# Patient Record
Sex: Female | Born: 1945 | Race: White | Hispanic: No | Marital: Married | Smoking: Never smoker
Health system: Southern US, Community
[De-identification: ages and names within clinical notes are randomized; demographics above are authoritative.]

## PROBLEM LIST (undated history)

## (undated) DIAGNOSIS — E7800 Pure hypercholesterolemia, unspecified: Secondary | ICD-10-CM

## (undated) DIAGNOSIS — F329 Major depressive disorder, single episode, unspecified: Secondary | ICD-10-CM

## (undated) DIAGNOSIS — Z9001 Acquired absence of eye: Secondary | ICD-10-CM

## (undated) DIAGNOSIS — K219 Gastro-esophageal reflux disease without esophagitis: Secondary | ICD-10-CM

## (undated) DIAGNOSIS — F419 Anxiety disorder, unspecified: Secondary | ICD-10-CM

## (undated) DIAGNOSIS — H35343 Macular cyst, hole, or pseudohole, bilateral: Secondary | ICD-10-CM

## (undated) DIAGNOSIS — F32A Depression, unspecified: Secondary | ICD-10-CM

## (undated) DIAGNOSIS — H409 Unspecified glaucoma: Secondary | ICD-10-CM

## (undated) HISTORY — PX: CHOLECYSTECTOMY: SHX55

## (undated) HISTORY — DX: Pure hypercholesterolemia, unspecified: E78.00

## (undated) HISTORY — PX: ABDOMINAL HYSTERECTOMY: SHX81

## (undated) HISTORY — PX: TUBAL LIGATION: SHX77

## (undated) HISTORY — PX: EYE SURGERY: SHX253

---

## 1998-06-29 ENCOUNTER — Other Ambulatory Visit: Admission: RE | Admit: 1998-06-29 | Discharge: 1998-06-29 | Payer: Self-pay | Admitting: Family Medicine

## 2001-10-05 ENCOUNTER — Other Ambulatory Visit: Admission: RE | Admit: 2001-10-05 | Discharge: 2001-10-05 | Payer: Self-pay | Admitting: Family Medicine

## 2003-02-16 ENCOUNTER — Encounter (INDEPENDENT_AMBULATORY_CARE_PROVIDER_SITE_OTHER): Payer: Self-pay | Admitting: *Deleted

## 2003-02-16 ENCOUNTER — Ambulatory Visit (HOSPITAL_COMMUNITY): Admission: RE | Admit: 2003-02-16 | Discharge: 2003-02-16 | Payer: Self-pay | Admitting: Gastroenterology

## 2005-01-24 ENCOUNTER — Ambulatory Visit: Payer: Self-pay | Admitting: Family Medicine

## 2005-02-28 ENCOUNTER — Ambulatory Visit: Payer: Self-pay | Admitting: Family Medicine

## 2005-05-03 ENCOUNTER — Ambulatory Visit: Payer: Self-pay | Admitting: Family Medicine

## 2005-05-20 ENCOUNTER — Ambulatory Visit: Payer: Self-pay | Admitting: Family Medicine

## 2005-05-23 ENCOUNTER — Ambulatory Visit (HOSPITAL_COMMUNITY): Admission: RE | Admit: 2005-05-23 | Discharge: 2005-05-24 | Payer: Self-pay | Admitting: Ophthalmology

## 2005-07-23 ENCOUNTER — Ambulatory Visit: Payer: Self-pay | Admitting: Family Medicine

## 2005-09-19 ENCOUNTER — Ambulatory Visit (HOSPITAL_COMMUNITY): Admission: RE | Admit: 2005-09-19 | Discharge: 2005-09-20 | Payer: Self-pay | Admitting: Ophthalmology

## 2005-10-15 ENCOUNTER — Ambulatory Visit: Payer: Self-pay | Admitting: Family Medicine

## 2005-10-28 ENCOUNTER — Ambulatory Visit: Payer: Self-pay | Admitting: Family Medicine

## 2005-11-28 ENCOUNTER — Ambulatory Visit: Payer: Self-pay | Admitting: Family Medicine

## 2006-03-25 ENCOUNTER — Ambulatory Visit: Payer: Self-pay | Admitting: Family Medicine

## 2006-11-13 ENCOUNTER — Ambulatory Visit (HOSPITAL_COMMUNITY): Admission: RE | Admit: 2006-11-13 | Discharge: 2006-11-13 | Payer: Self-pay | Admitting: Ophthalmology

## 2007-04-13 ENCOUNTER — Ambulatory Visit (HOSPITAL_COMMUNITY): Admission: RE | Admit: 2007-04-13 | Discharge: 2007-04-13 | Payer: Self-pay | Admitting: Family Medicine

## 2007-06-25 ENCOUNTER — Ambulatory Visit (HOSPITAL_COMMUNITY): Admission: RE | Admit: 2007-06-25 | Discharge: 2007-06-26 | Payer: Self-pay | Admitting: Ophthalmology

## 2008-09-27 ENCOUNTER — Ambulatory Visit (HOSPITAL_COMMUNITY): Admission: RE | Admit: 2008-09-27 | Discharge: 2008-09-27 | Payer: Self-pay | Admitting: Family Medicine

## 2009-05-16 ENCOUNTER — Ambulatory Visit (HOSPITAL_COMMUNITY): Admission: RE | Admit: 2009-05-16 | Discharge: 2009-05-17 | Payer: Self-pay | Admitting: Ophthalmology

## 2009-11-13 ENCOUNTER — Ambulatory Visit (HOSPITAL_COMMUNITY): Admission: RE | Admit: 2009-11-13 | Discharge: 2009-11-13 | Payer: Self-pay | Admitting: Family Medicine

## 2009-11-20 ENCOUNTER — Encounter: Admission: RE | Admit: 2009-11-20 | Discharge: 2009-11-20 | Payer: Self-pay | Admitting: Family Medicine

## 2009-12-08 ENCOUNTER — Encounter (INDEPENDENT_AMBULATORY_CARE_PROVIDER_SITE_OTHER): Payer: Self-pay | Admitting: *Deleted

## 2009-12-18 ENCOUNTER — Ambulatory Visit: Payer: Self-pay | Admitting: Internal Medicine

## 2009-12-27 ENCOUNTER — Ambulatory Visit: Payer: Self-pay | Admitting: Internal Medicine

## 2009-12-31 ENCOUNTER — Encounter: Payer: Self-pay | Admitting: Internal Medicine

## 2010-06-05 NOTE — Letter (Signed)
Summary: Patient Notice- Polyp Results  [facility]  [address] [place], [place] [address]   Phone: [phone]  Fax: [phone]        December 31, 2009 MRN: [id number]    Veronica Rojas [address] [place], [place]  [address]    Dear Ms. STAEBELL,  I am pleased to inform you that the colon polyp(s) removed during your recent colonoscopy was (were) found to be benign (no cancer detected) upon pathologic examination.  I recommend you have a repeat colonoscopy examination in 5 years to look for recurrent polyps, as having colon polyps increases your risk for having recurrent polyps or even colon cancer in the future.  Should you develop new or worsening symptoms of abdominal pain, bowel habit changes or bleeding from the rectum or bowels, please schedule an evaluation with either your primary care physician or with me.  Additional information/recommendations:  __ No further action with gastroenterology is needed at this time. Please      follow-up with your primary care physician for your other healthcare      needs.   Please call us if you are having persistent problems or have questions about your condition that have not been fully answered at this time.  Sincerely,  Hilarie Fredrickson MD  This letter has been electronically signed by your physician.  Appended Document: Patient Notice- Polyp Results letter mailed

## 2010-06-05 NOTE — Miscellaneous (Signed)
Summary: RECALL COLON/YF  Clinical Lists Changes  Medications: Added new medication of MOVIPREP 100 GM  SOLR (PEG-KCL-NACL-NASULF-NA ASC-C) As directed - Signed Rx of MOVIPREP 100 GM  SOLR (PEG-KCL-NACL-NASULF-NA ASC-C) As directed;  #1 x 0;  Signed;  Entered by: Clide Cliff RN;  Authorized by: Hilarie Fredrickson MD;  Method used: Electronically to [facility] [address], [place], [place], [place]  [address], Ph: [phone], Fax: [phone] Observations: Added new observation of ALLERGY REV: Done (12/18/2009 13:18)    Prescriptions: MOVIPREP 100 GM  SOLR (PEG-KCL-NACL-NASULF-NA ASC-C) As directed  #1 x 0   Entered by:   Clide Cliff RN   Authorized by:   Hilarie Fredrickson MD   Signed by:   Clide Cliff RN on 12/18/2009   Method used:   Electronically to        [facility]  [address] (retail)       [address]       [place], [place]  [address]       Ph: [phone]       Fax: [phone]   RxID:   [id number]

## 2010-06-05 NOTE — Letter (Signed)
Summary: [facility] Instructions  [facility]  [address] [place], [place] [address]   Phone: [phone]  Fax: [phone]       Veronica Rojas    [birth year]    MRN: [id number]        Procedure Day Dorna [place]:  [facility]  12/27/09     Arrival Time:  10:30AM      Procedure Time:  11:30AM     Location of Procedure:                    _X _  [facility] (4th Floor)  PREPARATION FOR COLONOSCOPY WITH MOVIPREP   Starting 5 days prior to your procedure 12/22/09 do not eat nuts, seeds, popcorn, corn, beans, peas,  salads, or any raw vegetables.  Do not take any fiber supplements (e.g. Metamucil, Citrucel, and Benefiber).  THE DAY BEFORE YOUR PROCEDURE         DATE: 12/26/09  DAY: TUESDAY  1.  Drink clear liquids the entire day-NO SOLID FOOD  2.  Do not drink anything colored red or purple.  Avoid juices with pulp.  No orange juice.  3.  Drink at least 64 oz. (8 glasses) of fluid/clear liquids during the day to prevent dehydration and help the prep work efficiently.  CLEAR LIQUIDS INCLUDE: Water Jello Ice Popsicles Tea (sugar ok, no milk/cream) Powdered fruit flavored drinks Coffee (sugar ok, no milk/cream) Gatorade Juice: apple, white grape, white cranberry  Lemonade Clear bullion, consomm, broth Carbonated beverages (any kind) Strained chicken noodle soup Hard Candy                             4.  In the morning, mix first dose of MoviPrep solution:    Empty 1 Pouch A and 1 Pouch B into the disposable container    Add lukewarm drinking water to the top line of the container. Mix to dissolve    Refrigerate (mixed solution should be used within 24 hrs)  5.  Begin drinking the prep at 5:00 p.m. The MoviPrep container is divided by 4 marks.   Every 15 minutes drink the solution down to the next mark (approximately 8 oz) until the full liter is complete.   6.  Follow completed prep with 16 oz of clear liquid of your choice (Nothing red or purple).   Continue to drink clear liquids until bedtime.  7.  Before going to bed, mix second dose of MoviPrep solution:    Empty 1 Pouch A and 1 Pouch B into the disposable container    Add lukewarm drinking water to the top line of the container. Mix to dissolve    Refrigerate  THE DAY OF YOUR PROCEDURE      DATE: 12/27/09  DAY: ONGEXBMW  Beginning at 6:30AM (5 hours before procedure):         1. Every 15 minutes, drink the solution down to the next mark (approx 8 oz) until the full liter is complete.  2. Follow completed prep with 16 oz. of clear liquid of your choice.    3. You may drink clear liquids until 9:30AM (2 HOURS BEFORE PROCEDURE).   MEDICATION INSTRUCTIONS  Unless otherwise instructed, you should take regular prescription medications with a small sip of water   as early as possible the morning of your procedure.           OTHER INSTRUCTIONS  You will need a responsible  adult at least 67 years of age to accompany you and drive you home.   This person must remain in the waiting room during your procedure.  Wear loose fitting clothing that is easily removed.  Leave jewelry and other valuables at home.  However, you may wish to bring a book to read or  an iPod/MP3 player to listen to music as you wait for your procedure to start.  Remove all body piercing jewelry and leave at home.  Total time from sign-in until discharge is approximately 2-3 hours.  You should go home directly after your procedure and rest.  You can resume normal activities the  day after your procedure.  The day of your procedure you should not:   Drive   Make legal decisions   Operate machinery   Drink alcohol   Return to work  You will receive specific instructions about eating, activities and medications before you leave.    The above instructions have been reviewed and explained to me by   Clide Cliff, RN_______________________    I fully understand and can verbalize these  instructions _____________________________ Date _________

## 2010-06-05 NOTE — Procedures (Signed)
Summary: Colonoscopy  Patient: Veronica Rojas Note: All result statuses are Final unless otherwise noted.  Tests: (1) Colonoscopy (COL)   COL Colonoscopy           DONE     [facility]     [address]     [place], [place]  [address]           COLONOSCOPY PROCEDURE REPORT           PATIENT:  [name], Veronica  MR#:  [id number]     BIRTHDATE:  [birth year], 63 yrs. old  GENDER:  female     ENDOSCOPIST:  Wilhemina Bonito. Eda Keys, MD     REF. BY:  Benedetto Goad, MD     PROCEDURE DATE:  12/27/2009     PROCEDURE:  Colonoscopy with snare polypectomy x 4     ASA CLASS:  Class II     INDICATIONS:  Routine Risk Screening     MEDICATIONS:   Fentanyl 100 mcg IV, Versed 10 mg IV           DESCRIPTION OF PROCEDURE:   After the risks benefits and     alternatives of the procedure were thoroughly explained, informed     consent was obtained.  Digital rectal exam was performed and     revealed no abnormalities.   The LB CF-H180AL [id number] endoscope     was introduced through the anus and advanced to the cecum, which     was identified by both the appendix and ileocecal valve, without     limitations.Time to cecum = 3:00 min.  The quality of the prep was     excellent, using MoviPrep.  The instrument was then slowly     withdrawn (time = 14:24 min.) as the colon was fully examined.     <<PROCEDUREIMAGES>>           FINDINGS:  Four polyps were found ascending colon (1mm),  sigmoid     colon (69mm,3mm) and rectum (2mm). Polyps were snared without     cautery. Retrieval was successful.   This was otherwise a normal     examination of the colon.   Retroflexed views in the rectum     revealed no abnormalities.    The scope was then withdrawn from     the patient and the procedure completed.           COMPLICATIONS:  None     ENDOSCOPIC IMPRESSION:     1) Four polyps - removed     2) Otherwise normal examination           RECOMMENDATIONS:     1) Repeat colonoscopy in 5 years if polyp adenomatous;  otherwise     10 years     ______________________________     Wilhemina Bonito. Eda Keys, MD           CC:  Benedetto Goad, MD; The Patient           n.     eSIGNED:   Wilhemina Bonito. Eda Keys at 12/27/2009 12:44 PM           [name], Veronica, [phone]  Note: An exclamation mark (!) indicates a result that was not dispersed into the flowsheet. Document Creation Date: 12/28/2009 11:30 AM _______________________________________________________________________  (1) Order result status: Final Collection or observation date-time: 12/27/2009 12:33 Requested date-time:  Receipt date-time:  Reported date-time:  Referring Physician:   Ordering Physician: Fransico Setters [phone]) Specimen Source:  Source: Launa Grill Order Number: [id number] Lab site:   Appended Document: Colonoscopy recall     Procedures Next Due Date:    Colonoscopy: 12/2014

## 2010-07-22 LAB — AUTOLOGOUS SERUM PATCH PREP

## 2010-07-22 LAB — CBC
HCT: 37.3 % (ref 36.0–46.0)
Hemoglobin: 13.1 g/dL (ref 12.0–15.0)
RBC: 3.98 MIL/uL (ref 3.87–5.11)
WBC: 6.3 10*3/uL (ref 4.0–10.5)

## 2010-09-18 NOTE — Op Note (Signed)
NAME:  Veronica Rojas, Veronica Rojas              ACCOUNT NO.:  [id number]   MEDICAL RECORD NO.:  [id number]          PATIENT TYPE:  AMB   LOCATION:  SDS                          FACILITY:  [facility]   PHYSICIAN:  John D. Ashley Royalty, M.D. DATE OF BIRTH:  01-Jan-1946   DATE OF PROCEDURE:  06/25/2007  DATE OF DISCHARGE:                               OPERATIVE REPORT   ADMISSION DIAGNOSIS:  Recurrent macular hole, right eye.   PROCEDURES:  Pars plana vitrectomy 20-gauge system, panretinal  photocoagulation, membrane peel, ILM removal, serum patch, right eye and  gas fluid exchange, right eye.   SURGEON:  Alan Mulder, M.D.   ASSISTANT:  Rosalie Doctor, M.A.   ANESTHESIA:  General.   DETAILS:  Usual prep and drape, peritomies at 8, 10 and 2 o'clock.  A 5-  mm infusion port anchored into place at 8 o'clock.  Contact lens ring  anchored into place at 6 and 12 o'clock.  The Provisc was placed on the  corneal surface, and the flat contact lens was placed.  The pars plana  vitrectomy was begun just behind the pseudophakos.  The vitrectomy was  carried out to the far periphery and down to the macular surface.  Preretinal membrane was peeled from the macular region with the lighted  pick ILM forceps, serrated forceps, the diamond-dusted membrane scraper  and the Eagle 27-gauge pick.  Once the membrane was removed, the Eagle  pick was used to engage the ILM peel it from around the macular hole for  360 degrees.  Once the ILM was removed, the instruments were removed  from the eye, and the indirect ophthalmoscope laser was moved into  place.  Then, 239 burns were placed around the retinal periphery with a  power 420 milliwatts, 1000 microns each, and 0.1 seconds each.  The edge  of the macular hole was pushed together with a diamond-dusted membrane  scraper.  A gas fluid exchange was performed.  Sufficient time was  allowed for additional fluid to track down the walls of the eye and  collect in the posterior  segment.  Additional fluid was removed with  [employer] Brush.  Serum patch was delivered.  Perfluoron propane  17% was exchanged for intravitreal gas.  The instruments were removed  from the eye, and 9-0 nylon was used to close the sclerotomy sites.  They were tested and found to be tight.  The conjunctiva was closed with  wet-field cautery.  Polymyxin and gentamicin were irrigated in the tenon  space.  Atropine solution was applied.  Marcaine was injected around the  globe for postoperative pain.  Decadron 10 mg was injected into the  lower subconjunctival space.  Closing pressure was 10 with a Production manager.  TobraDex ophthalmic ointment, a patch and shield were  placed.  The patient was awakened and taken to recovery in satisfactory  condition.      [name]. Ashley Royalty, M.D.  Electronically Signed     JDM/MEDQ  D:  06/25/2007  T:  06/26/2007  Job:  [id number]

## 2010-09-18 NOTE — Op Note (Signed)
NAME:  Griffeth, [place]              ACCOUNT NO.:  [id number]   MEDICAL RECORD NO.:  [id number]          PATIENT TYPE:  AMB   LOCATION:  SDS                          FACILITY:  [facility]   PHYSICIAN:  Chalmers Guest, M.D.     DATE OF BIRTH:  [birth year]   DATE OF PROCEDURE:  11/13/2006  DATE OF DISCHARGE:                               OPERATIVE REPORT   PREOPERATIVE DIAGNOSIS:  Visually significant cataract, left eye.   POSTOPERATIVE DIAGNOSIS:  Visually significant cataract, left eye.   PROCEDURE:  Phakoemulsification with intraocular lens implant.   SURGEON:  Chalmers Guest, M.D.   ANESTHESIA:  Consisted of 2% Xylocaine with epinephrine in  50:50  mixture with 0.75% Marcaine with an ampule of Wydase.   DESCRIPTION OF PROCEDURE:  The patient was transported to the operating  room, where monitors were placed, and the patient was given a peribulbar  block under sedation with the afore-mentioned anesthetic agents.  Following this, the patient's face was prepped and draped in the usual  sterile fashion.  With the surgeon sitting temporally, the operating  microscope was positioned.  A Weck-cel sponge was used to fixate the  globe, and then a 15-degree blade was used to enter at the 6-o'clock  position through clear cornea.  Viscoat was then injected in the eye.  Following this, another Weck-cel sponge was used to fixate the eye, and  a 2.75-mm keratome was then used in a slide fashion to enter through  clear cornea.  Additional viscoelastic was injected, and then a bent 25-  gauge needle was used to incise the anterior capsule, and a curvilinear  capsulorrhexis was formed.  The patient had some remaining  kinesia;  therefore, Xylocaine, preservative free, 0.1 was injected in the  anterior chamber.  Following this, BSS was used to hydrodissect the  nucleus, and then the phakoemulsification unit was used to crack and  divide the nucleus.  All the nuclear fragments were aspirated from the  eye.   The IA was then used to aspirate the cortical fibers from the eye.  The posterior capsule remained intact.  There was some subincisional  cortex remaining.  The intraocular lens implant was then examined.  The  lens was placed in a sterile field and I asked the assistant to inject  the lens and she stated that she did not know she had to use the  injector.  At this point, I turned the microscope light off, and after  about a 10 or 15-minute pause, the injector had been found.  The  injector was loaded.  The lens was placed in the shooter.  The leading  haptic was straight instead of curved. The haptic was then injected into  the capsular bag.  The Kuglen hook was used to rotate the lens into  position.  The lens was pushed back, and then the IA was used to remove  viscoelastic and remaining cortical fibers from the eye.  Miostat was  injected in the eye.  The  incision was hydrated with BSS.  A single 10-0 nylon suture was placed  to secure the incision.  There was a watertight closure with no leakage.  Topical TobraDex was applied to the eye.  A patch and a Fox shield were  placed, and the patient returned to the recovery area in stable  condition.  Complications were none.      Chalmers Guest, M.D.  Electronically Signed     RW/MEDQ  D:  11/13/2006  T:  11/14/2006  Job:  [id number]   cc:   Chalmers Guest, MD

## 2010-09-21 NOTE — Op Note (Signed)
NAME:  Fix, [place]              ACCOUNT NO.:  [id number]   MEDICAL RECORD NO.:  [id number]          PATIENT TYPE:  OIB   LOCATION:  5707                         FACILITY:  [facility]   PHYSICIAN:  [name]. Ashley Royalty, M.D. DATE OF BIRTH:  Sep 14, 1945   DATE OF PROCEDURE:  09/19/2005  DATE OF DISCHARGE:                                 OPERATIVE REPORT   ADMISSION DIAGNOSIS:  Macular hole left eye.   PROCEDURES:  1.  Pars plana vitrectomy.  2.  Membrane peel  3.  Retinal photocoagulation.  4.  Gas fluid exchange.  5.  Serum patch.  6.  Perfluoropropane injection left eye.   SURGEON:  [name]. Ashley Royalty, M.D.   ASSISTANT:  Rosalie Doctor.   ANESTHESIA:  General.   DETAILS:  Usual prep and drape, peritomies at 10, 2, and 4 o'clock.  The 5-  mm infusion port anchored into place at 4 o'clock.  The lighted pick and the  cutter were placed at 10 and 2 o'clock respectively.  Contact lens ring  anchored into place at 6 and 12 o'clock.  Provisc placed on the corneal  surface.  The flat contact lens was placed.  A pars plana vitrectomy was  begun in a core fashion.  The vitrectomy was carried down to the macular  surface where membranes were seen around the macula.  A macular hole was  present.  The silicone tip suction line was introduced into the eye; and the  fish strike sign occurred.   The posterior hyaloid pulled off and also unroofed the macular hole.  These  membranes were then removed with a vitreous cutter.  The vitrectomy was  carried out to the far periphery, down to the vitreous base for 360 degrees.  All vitreous was removed.  The indirect ophthalmoscope laser was moved into  place and 523 burns were placed around the retinal periphery with the power  of 1000 milliwatts, 1000 microns each, and 0.07 seconds each.  A gas fluid  exchange was carried out.  Sufficient time was allowed for additional fluid  to track down the walls of the eye and collect in the posterior segment.  Additional fluid was removed.   Serum patch was delivered.  Perfluoropropane 16% was exchanged for  intravitreal gas.  The instruments were removed from the eye and 9-0 nylon  was used to close the sclerotomy sites.  The conjunctiva was closed with wet-  field cautery.  The wounds were all tested with Weck-cel and found to be  tight.  Polymyxin and gentamicin were irrigated into Tenon's space.  Atropine solution was applied.  Marcaine was injected around the globe for  postop pain Decadron 10 mg was injected into the lower subconjunctival  space.  TobraDex ophthalmic ointment, a patch  and shield were placed.  A closing pressure was 10 with a Baer keratometer.  Complications:  None.  Duration:  1 hour.  A patch and shield were placed.  The patient was awakened and taken to recovery in satisfactory condition.      [name]. Ashley Royalty, M.D.  Electronically Signed  JDM/MEDQ  D:  09/19/2005  T:  09/20/2005  Job:  [id number]

## 2010-09-21 NOTE — Op Note (Signed)
   NAME:  Veronica Rojas, Veronica Rojas                        ACCOUNT NO.:  [id number]   MEDICAL RECORD NO.:  [id number]                   PATIENT TYPE:  AMB   LOCATION:  ENDO                                 FACILITY:  [facility]   PHYSICIAN:  John C. Madilyn Fireman, M.D.                 DATE OF BIRTH:  [birth year]   DATE OF PROCEDURE:  02/16/2003  DATE OF DISCHARGE:                                 OPERATIVE REPORT   PROCEDURE:  Colonoscopy with biopsy.   INDICATION FOR PROCEDURE:  Chronic diarrhea and colon cancer screening.   DESCRIPTION OF PROCEDURE:  The patient was placed in the left lateral  decubitus position and placed on the pulse monitor with continuous low-flow  oxygen delivered by nasal cannula.  She was sedated with 100 mcg IV fentanyl  and 10 mg IV Versed.  The Olympus video colonoscope was inserted into the  rectum and advanced to the cecum, confirmed by transillumination at  McBurney's point and visualization of the ileocecal valve and appendiceal  orifice.  The prep was excellent.  The cecum, ascending, transverse,  descending, and sigmoid colon all appeared normal without masses, polyps,  diverticula, or other mucosal abnormalities.  The rectum likewise appeared  normal and retroflexed view of the anus revealed no obvious internal  hemorrhoids.  Biopsies were taken at the sigmoid and rectum to rule out  microscopic colitis.  The scope was then withdrawn and the patient returned  to the recovery room in stable condition.  She tolerated the procedure well,  and there were no immediate complications.   IMPRESSION:  Normal colonoscopy.   PLAN:  Await biopsy results to rule out microscopic colitis.  If negative,  will probably try an antispasmodic or Questran for her diarrhea.                                                John C. Madilyn Fireman, M.D.    JCH/MEDQ  D:  02/16/2003  T:  02/16/2003  Job:  [id number]

## 2010-09-21 NOTE — Op Note (Signed)
NAME:  Veronica Rojas, Veronica Rojas              ACCOUNT NO.:  [id number]   MEDICAL RECORD NO.:  [id number]          PATIENT TYPE:  AMB   LOCATION:  SDS                          FACILITY:  [facility]   PHYSICIAN:  John D. Ashley Royalty, M.D. DATE OF BIRTH:  10/16/1945   DATE OF PROCEDURE:  05/23/2005  DATE OF DISCHARGE:                                 OPERATIVE REPORT   ADMISSION DIAGNOSIS:  Macular hole, right eye.   PROCEDURES:  Pars plana vitrectomy, membrane peel, serum patch, retinal  photocoagulation, gas fluid exchange, Perfluoropropane injection, right eye.   SURGEON:  Alan Mulder.   ASSISTANT:  Rosalie Doctor, MA   ANESTHESIA:  General.   DETAILS:  Usual prep and drape, peritomies at 8, 10 and 2 o'clock. The 5 mm  infusion port anchored in place at 8 o'clock.  The lighted pick and a cutter  placed at 10 and 2 o'clock respectively. Contact lens ring anchored into  place at 6 and 12 o'clock. Provisc placed in the corneal surface. Flat  contact lens placed. Pars plana vitrectomy was begun just behind the  crystalline lens. Blood and vitreous was encountered in the vitreous cavity.  These membranes were seen down to the macular surface. The entire vitreous  cavity was removed, the entire vitreous was removed with the 20 gauge  vitrectomy system. Attention was carried down to the macular hole region  where Kenalog was used to stain the internal limiting membrane. The silicone  tip suction line was drawn down to the macular region and posterior  membranes were engaged and removed. The diamond dusted membrane scraper was  used to remove the internal limiting membrane around the around the macular  hole. Once the entire ILM was removed, the vitrectomy was carried out to the  peripheral vitreous space.  Vitreous was trimmed down to the vitreous base.  A gas fluid exchange was performed. Before the gas fluid exchange was  performed, the indirect ophthalmoscope laser was moved into place.  453  burns  were placed around the retinal periphery with a power 500 milliwatts,  1000 microns each and 0.05 seconds each.  Gas fluid exchange was performed.  Sufficient time was allowed for additional fluid to track down the walls of  the eye and collect in the posterior segment.  Serum patch and  Perfluoropropane were prepared.  17% C3F8 was used. The additional fluid was  removed with the [employer] [place] [employer] brush. The serum patch was delivered. The  Perfluoropropane 17% was exchanged for intravitreal air. The instruments  were removed from the eye. 9-0 nylon was used to close the sclerotomy sites.  The sites were tested and found to be tight. Conjunctiva was closed with wet-  field cautery. Polymyxin and gentamicin were irrigated into Tenon's space,  Decadron 10 milligrams was injected to the lower subconjunctival space.  Marcaine was injected around the globe  for postop pain. TobraDex ophthalmic ointment, patch and shield were placed.  Closing pressure was 10 with a Barraquer tonometer. Complications none.  Duration 1 hour. The patient was awakened, taken to recovery in satisfactory  condition.  [name]. Ashley Royalty, M.D.  Electronically Signed     JDM/MEDQ  D:  05/23/2005  T:  05/23/2005  Job:  [id number]

## 2010-12-17 ENCOUNTER — Encounter (INDEPENDENT_AMBULATORY_CARE_PROVIDER_SITE_OTHER): Payer: Medicare Other | Admitting: Ophthalmology

## 2010-12-17 DIAGNOSIS — H35349 Macular cyst, hole, or pseudohole, unspecified eye: Secondary | ICD-10-CM

## 2010-12-17 DIAGNOSIS — H3530 Unspecified macular degeneration: Secondary | ICD-10-CM

## 2011-01-10 ENCOUNTER — Other Ambulatory Visit: Payer: Self-pay | Admitting: Family Medicine

## 2011-01-10 DIAGNOSIS — Z1231 Encounter for screening mammogram for malignant neoplasm of breast: Secondary | ICD-10-CM

## 2011-01-10 DIAGNOSIS — M8580 Other specified disorders of bone density and structure, unspecified site: Secondary | ICD-10-CM

## 2011-01-25 ENCOUNTER — Ambulatory Visit
Admission: RE | Admit: 2011-01-25 | Discharge: 2011-01-25 | Disposition: A | Discharge status: Home / Self Care | Payer: Medicare Other | Source: Ambulatory Visit | Attending: Family Medicine | Admitting: Family Medicine

## 2011-01-25 DIAGNOSIS — M8580 Other specified disorders of bone density and structure, unspecified site: Secondary | ICD-10-CM

## 2011-01-25 DIAGNOSIS — Z1231 Encounter for screening mammogram for malignant neoplasm of breast: Secondary | ICD-10-CM

## 2011-01-25 LAB — CBC
HCT: 35.3 — ABNORMAL LOW
MCHC: 34
MCV: 86.6
Platelets: 293
WBC: 5.9

## 2011-01-25 LAB — AUTOLOGOUS SERUM PATCH PREP

## 2011-01-30 ENCOUNTER — Other Ambulatory Visit: Payer: Medicare Other

## 2011-01-30 ENCOUNTER — Ambulatory Visit
Admission: RE | Admit: 2011-01-30 | Discharge: 2011-01-30 | Disposition: A | Discharge status: Home / Self Care | Payer: Medicare Other | Source: Ambulatory Visit | Attending: Family Medicine | Admitting: Family Medicine

## 2011-02-19 LAB — CBC
HCT: 41.8
RBC: 4.59

## 2011-06-26 ENCOUNTER — Ambulatory Visit (INDEPENDENT_AMBULATORY_CARE_PROVIDER_SITE_OTHER): Payer: Medicare Other | Admitting: Ophthalmology

## 2011-06-26 DIAGNOSIS — H3530 Unspecified macular degeneration: Secondary | ICD-10-CM

## 2011-06-26 DIAGNOSIS — H35349 Macular cyst, hole, or pseudohole, unspecified eye: Secondary | ICD-10-CM

## 2011-06-26 DIAGNOSIS — H4010X0 Unspecified open-angle glaucoma, stage unspecified: Secondary | ICD-10-CM

## 2011-12-25 ENCOUNTER — Ambulatory Visit (INDEPENDENT_AMBULATORY_CARE_PROVIDER_SITE_OTHER): Payer: Medicare Other | Admitting: Ophthalmology

## 2011-12-25 DIAGNOSIS — H35379 Puckering of macula, unspecified eye: Secondary | ICD-10-CM

## 2011-12-25 DIAGNOSIS — H4010X0 Unspecified open-angle glaucoma, stage unspecified: Secondary | ICD-10-CM

## 2011-12-25 DIAGNOSIS — H35349 Macular cyst, hole, or pseudohole, unspecified eye: Secondary | ICD-10-CM

## 2012-01-23 ENCOUNTER — Other Ambulatory Visit: Payer: Self-pay | Admitting: Family Medicine

## 2012-01-23 DIAGNOSIS — Z1231 Encounter for screening mammogram for malignant neoplasm of breast: Secondary | ICD-10-CM

## 2012-01-30 SURGERY — Surgical Case
Anesthesia: *Unknown

## 2012-03-23 ENCOUNTER — Ambulatory Visit
Admission: RE | Admit: 2012-03-23 | Discharge: 2012-03-23 | Disposition: A | Discharge status: Home / Self Care | Payer: Medicare Other | Source: Ambulatory Visit | Attending: Family Medicine | Admitting: Family Medicine

## 2012-03-23 DIAGNOSIS — Z1231 Encounter for screening mammogram for malignant neoplasm of breast: Secondary | ICD-10-CM

## 2012-03-24 ENCOUNTER — Other Ambulatory Visit: Payer: Self-pay | Admitting: Family Medicine

## 2012-03-24 DIAGNOSIS — R928 Other abnormal and inconclusive findings on diagnostic imaging of breast: Secondary | ICD-10-CM

## 2012-04-09 ENCOUNTER — Ambulatory Visit
Admission: RE | Admit: 2012-04-09 | Discharge: 2012-04-09 | Disposition: A | Discharge status: Home / Self Care | Payer: Medicare Other | Source: Ambulatory Visit | Attending: Family Medicine | Admitting: Family Medicine

## 2012-04-09 DIAGNOSIS — R928 Other abnormal and inconclusive findings on diagnostic imaging of breast: Secondary | ICD-10-CM

## 2012-06-26 ENCOUNTER — Ambulatory Visit (INDEPENDENT_AMBULATORY_CARE_PROVIDER_SITE_OTHER): Payer: Medicare Other | Admitting: Ophthalmology

## 2012-06-26 DIAGNOSIS — H35379 Puckering of macula, unspecified eye: Secondary | ICD-10-CM

## 2013-02-22 ENCOUNTER — Other Ambulatory Visit: Payer: Self-pay | Admitting: Family Medicine

## 2013-02-22 DIAGNOSIS — R921 Mammographic calcification found on diagnostic imaging of breast: Secondary | ICD-10-CM

## 2013-03-24 ENCOUNTER — Ambulatory Visit
Admission: RE | Admit: 2013-03-24 | Discharge: 2013-03-24 | Disposition: A | Discharge status: Home / Self Care | Payer: Medicare Other | Source: Ambulatory Visit | Attending: Family Medicine | Admitting: Family Medicine

## 2013-03-24 DIAGNOSIS — R921 Mammographic calcification found on diagnostic imaging of breast: Secondary | ICD-10-CM

## 2013-07-02 ENCOUNTER — Ambulatory Visit (INDEPENDENT_AMBULATORY_CARE_PROVIDER_SITE_OTHER): Payer: Medicare Other | Admitting: Ophthalmology

## 2013-07-02 DIAGNOSIS — H4010X0 Unspecified open-angle glaucoma, stage unspecified: Secondary | ICD-10-CM

## 2013-07-02 DIAGNOSIS — H35349 Macular cyst, hole, or pseudohole, unspecified eye: Secondary | ICD-10-CM

## 2014-05-19 ENCOUNTER — Other Ambulatory Visit: Payer: Self-pay | Admitting: Family Medicine

## 2014-05-19 DIAGNOSIS — R921 Mammographic calcification found on diagnostic imaging of breast: Secondary | ICD-10-CM

## 2014-05-24 ENCOUNTER — Ambulatory Visit
Admission: RE | Admit: 2014-05-24 | Discharge: 2014-05-24 | Disposition: A | Discharge status: Home / Self Care | Payer: Medicare Other | Source: Ambulatory Visit | Attending: Family Medicine | Admitting: Family Medicine

## 2014-05-24 DIAGNOSIS — R921 Mammographic calcification found on diagnostic imaging of breast: Secondary | ICD-10-CM

## 2014-07-11 ENCOUNTER — Ambulatory Visit (INDEPENDENT_AMBULATORY_CARE_PROVIDER_SITE_OTHER): Payer: Medicare Other | Admitting: Ophthalmology

## 2014-07-11 DIAGNOSIS — H35343 Macular cyst, hole, or pseudohole, bilateral: Secondary | ICD-10-CM

## 2014-07-11 DIAGNOSIS — H4011X2 Primary open-angle glaucoma, moderate stage: Secondary | ICD-10-CM | POA: Diagnosis not present

## 2014-07-11 DIAGNOSIS — H59032 Cystoid macular edema following cataract surgery, left eye: Secondary | ICD-10-CM | POA: Diagnosis not present

## 2014-11-04 ENCOUNTER — Encounter: Payer: Self-pay | Admitting: Internal Medicine

## 2014-11-10 ENCOUNTER — Ambulatory Visit (INDEPENDENT_AMBULATORY_CARE_PROVIDER_SITE_OTHER): Payer: Medicare Other | Admitting: Ophthalmology

## 2014-11-10 DIAGNOSIS — H35343 Macular cyst, hole, or pseudohole, bilateral: Secondary | ICD-10-CM

## 2014-11-10 DIAGNOSIS — H59032 Cystoid macular edema following cataract surgery, left eye: Secondary | ICD-10-CM

## 2014-11-10 DIAGNOSIS — H3531 Nonexudative age-related macular degeneration: Secondary | ICD-10-CM

## 2014-11-23 ENCOUNTER — Encounter: Payer: Self-pay | Admitting: Internal Medicine

## 2015-01-11 ENCOUNTER — Encounter (INDEPENDENT_AMBULATORY_CARE_PROVIDER_SITE_OTHER): Payer: Medicare Other | Admitting: Ophthalmology

## 2015-01-11 DIAGNOSIS — H35343 Macular cyst, hole, or pseudohole, bilateral: Secondary | ICD-10-CM | POA: Diagnosis not present

## 2015-01-11 DIAGNOSIS — H59032 Cystoid macular edema following cataract surgery, left eye: Secondary | ICD-10-CM | POA: Diagnosis not present

## 2015-04-12 ENCOUNTER — Ambulatory Visit (HOSPITAL_COMMUNITY)
Admission: RE | Admit: 2015-04-12 | Discharge: 2015-04-12 | Disposition: A | Discharge status: Home / Self Care | Payer: Medicare Other | Source: Ambulatory Visit | Attending: Ophthalmology | Admitting: Ophthalmology

## 2015-04-12 ENCOUNTER — Ambulatory Visit (HOSPITAL_COMMUNITY): Payer: Medicare Other | Admitting: Anesthesiology

## 2015-04-12 ENCOUNTER — Other Ambulatory Visit: Payer: Self-pay | Admitting: Ophthalmology

## 2015-04-12 ENCOUNTER — Encounter (HOSPITAL_COMMUNITY)
Admission: RE | Disposition: A | Discharge status: Home / Self Care | Payer: Self-pay | Source: Ambulatory Visit | Attending: Ophthalmology

## 2015-04-12 ENCOUNTER — Encounter (HOSPITAL_COMMUNITY): Payer: Self-pay | Admitting: *Deleted

## 2015-04-12 DIAGNOSIS — Y838 Other surgical procedures as the cause of abnormal reaction of the patient, or of later complication, without mention of misadventure at the time of the procedure: Secondary | ICD-10-CM | POA: Diagnosis not present

## 2015-04-12 DIAGNOSIS — T814XXA Infection following a procedure, initial encounter: Secondary | ICD-10-CM | POA: Insufficient documentation

## 2015-04-12 DIAGNOSIS — H409 Unspecified glaucoma: Secondary | ICD-10-CM | POA: Diagnosis not present

## 2015-04-12 DIAGNOSIS — H44002 Unspecified purulent endophthalmitis, left eye: Secondary | ICD-10-CM | POA: Insufficient documentation

## 2015-04-12 DIAGNOSIS — Z9689 Presence of other specified functional implants: Secondary | ICD-10-CM | POA: Diagnosis not present

## 2015-04-12 DIAGNOSIS — H20052 Hypopyon, left eye: Secondary | ICD-10-CM | POA: Insufficient documentation

## 2015-04-12 HISTORY — PX: PARS PLANA VITRECTOMY: SHX2166

## 2015-04-12 LAB — POCT I-STAT 4, (NA,K, GLUC, HGB,HCT)
Glucose, Bld: 113 mg/dL — ABNORMAL HIGH (ref 65–99)
HCT: 41 % (ref 36.0–46.0)
HEMOGLOBIN: 13.9 g/dL (ref 12.0–15.0)
Potassium: 3.7 mmol/L (ref 3.5–5.1)
Sodium: 138 mmol/L (ref 135–145)

## 2015-04-12 LAB — GRAM STAIN

## 2015-04-12 SURGERY — PARS PLANA VITRECTOMY 25 GAUGE FOR ENDOPHTHALMITIS
Anesthesia: General | Site: Eye | Laterality: Left

## 2015-04-12 SURGERY — PARS PLANA VITRECTOMY 25 GAUGE FOR ENDOPHTHALMITIS
Anesthesia: Monitor Anesthesia Care | Laterality: Left

## 2015-04-12 MED ORDER — ACETAMINOPHEN 10 MG/ML IV SOLN
1000.0000 mg | Freq: Once | INTRAVENOUS | Status: AC
  Administered 2015-04-12: 1000 mg via INTRAVENOUS

## 2015-04-12 MED ORDER — CEFTAZIDIME INTRAVITREAL INJECTION 2.25 MG/0.1 ML
2.2500 mg | INTRAVITREAL | Status: AC
  Administered 2015-04-12: 2.25 mg via INTRAVITREAL
  Filled 2015-04-12: qty 0.1

## 2015-04-12 MED ORDER — HYDROMORPHONE HCL 1 MG/ML IJ SOLN
INTRAMUSCULAR | Status: AC
  Filled 2015-04-12: qty 1

## 2015-04-12 MED ORDER — ACETAMINOPHEN 10 MG/ML IV SOLN
INTRAVENOUS | Status: AC
  Administered 2015-04-12: 1000 mg via INTRAVENOUS
  Filled 2015-04-12: qty 100

## 2015-04-12 MED ORDER — PROPOFOL 10 MG/ML IV BOLUS
INTRAVENOUS | Status: DC | PRN
  Administered 2015-04-12: 200 mg via INTRAVENOUS
  Administered 2015-04-12: 40 mg via INTRAVENOUS

## 2015-04-12 MED ORDER — BSS PLUS IO SOLN
INTRAOCULAR | Status: AC
  Filled 2015-04-12: qty 500

## 2015-04-12 MED ORDER — HYDROMORPHONE HCL 1 MG/ML IJ SOLN
0.2500 mg | INTRAMUSCULAR | Status: DC | PRN
  Administered 2015-04-12: 0.25 mg via INTRAVENOUS

## 2015-04-12 MED ORDER — SUCCINYLCHOLINE CHLORIDE 20 MG/ML IJ SOLN
INTRAMUSCULAR | Status: DC | PRN
  Administered 2015-04-12: 120 mg via INTRAVENOUS

## 2015-04-12 MED ORDER — VANCOMYCIN INTRAVITREAL INJECTION 1 MG/0.1 ML
1.0000 mg | INTRAOCULAR | Status: AC
  Administered 2015-04-12: 1 mg via INTRAVITREAL
  Filled 2015-04-12: qty 0.1

## 2015-04-12 MED ORDER — HYPROMELLOSE (GONIOSCOPIC) 2.5 % OP SOLN
OPHTHALMIC | Status: DC | PRN
  Administered 2015-04-12: 1 [drp] via OPHTHALMIC

## 2015-04-12 MED ORDER — MEPERIDINE HCL 25 MG/ML IJ SOLN
6.2500 mg | INTRAMUSCULAR | Status: DC | PRN
Start: 2015-04-12 — End: 2015-04-13

## 2015-04-12 MED ORDER — EPINEPHRINE HCL 1 MG/ML IJ SOLN
INTRAMUSCULAR | Status: AC
  Filled 2015-04-12: qty 1

## 2015-04-12 MED ORDER — BSS PLUS IO SOLN
INTRAOCULAR | Status: DC | PRN
  Administered 2015-04-12: 1 via INTRAOCULAR

## 2015-04-12 MED ORDER — 0.9 % SODIUM CHLORIDE (POUR BTL) OPTIME
TOPICAL | Status: DC | PRN
  Administered 2015-04-12: 200 mL

## 2015-04-12 MED ORDER — EPINEPHRINE HCL 1 MG/ML IJ SOLN
INTRAMUSCULAR | Status: DC | PRN
  Administered 2015-04-12: .3 mL

## 2015-04-12 MED ORDER — LIDOCAINE HCL 2 % IJ SOLN
INTRAMUSCULAR | Status: DC | PRN
  Administered 2015-04-12: 10 mL

## 2015-04-12 MED ORDER — BSS IO SOLN
INTRAOCULAR | Status: DC | PRN
  Administered 2015-04-12: 15 mL

## 2015-04-12 MED ORDER — VANCOMYCIN HCL IN DEXTROSE 1-5 GM/200ML-% IV SOLN
1000.0000 mg | Freq: Once | INTRAVENOUS | Status: AC
  Administered 2015-04-12: 1000 mg via INTRAVENOUS
  Filled 2015-04-12: qty 200

## 2015-04-12 MED ORDER — LACTATED RINGERS IV SOLN
INTRAVENOUS | Status: DC | PRN
  Administered 2015-04-12: 18:00:00 via INTRAVENOUS

## 2015-04-12 MED ORDER — VANCOMYCIN HCL IN DEXTROSE 1-5 GM/200ML-% IV SOLN: 1000.0000 mg | Freq: Once | INTRAVENOUS | Status: DC

## 2015-04-12 MED ORDER — LACTATED RINGERS IV SOLN
INTRAVENOUS | Status: DC
  Administered 2015-04-12: 18:00:00 via INTRAVENOUS

## 2015-04-12 MED ORDER — ONDANSETRON HCL 4 MG/2ML IJ SOLN
INTRAMUSCULAR | Status: DC | PRN
  Administered 2015-04-12: 4 mg via INTRAVENOUS

## 2015-04-12 MED ORDER — DEXTROSE 5 % IV SOLN
1.0000 g | Freq: Once | INTRAVENOUS | Status: AC
  Administered 2015-04-12: 1 g via INTRAVENOUS
  Filled 2015-04-12: qty 1

## 2015-04-12 MED ORDER — FENTANYL CITRATE (PF) 100 MCG/2ML IJ SOLN
INTRAMUSCULAR | Status: DC | PRN
  Administered 2015-04-12: 100 ug via INTRAVENOUS
  Administered 2015-04-12: 25 ug via INTRAVENOUS

## 2015-04-12 MED ORDER — HYPROMELLOSE (GONIOSCOPIC) 2.5 % OP SOLN
OPHTHALMIC | Status: AC
  Filled 2015-04-12: qty 15

## 2015-04-12 MED ORDER — LIDOCAINE HCL (CARDIAC) 20 MG/ML IV SOLN
INTRAVENOUS | Status: DC | PRN
  Administered 2015-04-12: 80 mg via INTRAVENOUS

## 2015-04-12 MED ORDER — FENTANYL CITRATE (PF) 250 MCG/5ML IJ SOLN
INTRAMUSCULAR | Status: AC
  Filled 2015-04-12: qty 5

## 2015-04-12 MED ORDER — PROMETHAZINE HCL 25 MG/ML IJ SOLN
6.2500 mg | INTRAMUSCULAR | Status: DC | PRN
Start: 2015-04-12 — End: 2015-04-13

## 2015-04-12 MED ORDER — DEXAMETHASONE SODIUM PHOSPHATE 10 MG/ML IJ SOLN
INTRAMUSCULAR | Status: AC
  Filled 2015-04-12: qty 1

## 2015-04-12 MED ORDER — DEXAMETHASONE SODIUM PHOSPHATE 10 MG/ML IJ SOLN
INTRAMUSCULAR | Status: DC | PRN
  Administered 2015-04-12: 10 mg

## 2015-04-12 SURGICAL SUPPLY — 43 items
APPLICATOR COTTON TIP 6IN STRL (MISCELLANEOUS) ×3 IMPLANT
APPLICATOR DR MATTHEWS STRL (MISCELLANEOUS) IMPLANT
BLADE 10 SAFETY STRL DISP (BLADE) IMPLANT
CANNULA ANT CHAM MAIN (OPHTHALMIC RELATED) IMPLANT
CANNULA FLEX TIP 25G (CANNULA) IMPLANT
CANNULA VLV SOFT TIP 25GA (OPHTHALMIC) ×3 IMPLANT
CORDS BIPOLAR (ELECTRODE) IMPLANT
COVER MAYO STAND STRL (DRAPES) ×3 IMPLANT
DRAPE OPHTHALMIC 77X100 STRL (CUSTOM PROCEDURE TRAY) ×3 IMPLANT
FILTER BLUE MILLIPORE (MISCELLANEOUS) IMPLANT
FILTER STRAW FLUID ASPIR (MISCELLANEOUS) IMPLANT
FORCEPS ECKARDT ILM 25G SERR (OPHTHALMIC RELATED) ×3 IMPLANT
GLOVE BIOGEL PI IND STRL 7.0 (GLOVE) ×1 IMPLANT
GLOVE BIOGEL PI INDICATOR 7.0 (GLOVE) ×2
GLOVE SS BIOGEL STRL SZ 8.5 (GLOVE) ×1 IMPLANT
GLOVE SUPERSENSE BIOGEL SZ 8.5 (GLOVE) ×2
GOWN STRL REUS W/ TWL LRG LVL3 (GOWN DISPOSABLE) ×1 IMPLANT
GOWN STRL REUS W/TWL LRG LVL3 (GOWN DISPOSABLE) ×2
KIT BASIN OR (CUSTOM PROCEDURE TRAY) ×3 IMPLANT
KIT ROOM TURNOVER OR (KITS) ×3 IMPLANT
KNIFE GRIESHABER SHARP 2.5MM (MISCELLANEOUS) ×3 IMPLANT
LENS BIOM SUPER VIEW SET DISP (OPHTHALMIC RELATED) ×3 IMPLANT
NEEDLE 18GX1X1/2 (RX/OR ONLY) (NEEDLE) ×3 IMPLANT
NEEDLE 25GX 5/8IN NON SAFETY (NEEDLE) ×3 IMPLANT
NEEDLE HYPO 30X.5 LL (NEEDLE) ×6 IMPLANT
PACK VITRECTOMY CUSTOM (CUSTOM PROCEDURE TRAY) ×3 IMPLANT
PAD ARMBOARD 7.5X6 YLW CONV (MISCELLANEOUS) ×6 IMPLANT
PAK PIK VITRECTOMY CVS 25GA (OPHTHALMIC) ×3 IMPLANT
PENCIL BIPOLAR 25GA STR DISP (OPHTHALMIC RELATED) IMPLANT
PROBE LASER ILLUM FLEX CVD 25G (OPHTHALMIC) IMPLANT
RESERVOIR BACK FLUSH (MISCELLANEOUS) IMPLANT
ROLLS DENTAL (MISCELLANEOUS) IMPLANT
STOCKINETTE IMPERVIOUS 9X36 MD (GAUZE/BANDAGES/DRESSINGS) ×9 IMPLANT
STOPCOCK 4 WAY LG BORE MALE ST (IV SETS) IMPLANT
SUT ETHILON 8 0 TG100 8 (SUTURE) ×3 IMPLANT
SUT MERSILENE 5 0 RD 1 DA (SUTURE) IMPLANT
SUT VICRYL 7 0 TG140 8 (SUTURE) ×3 IMPLANT
SWAB COLLECTION DEVICE MRSA (MISCELLANEOUS) ×3 IMPLANT
SYR TB 1ML LUER SLIP (SYRINGE) ×6 IMPLANT
TAPE SURG TRANSPORE 1 IN (GAUZE/BANDAGES/DRESSINGS) ×1 IMPLANT
TAPE SURGICAL TRANSPORE 1 IN (GAUZE/BANDAGES/DRESSINGS) ×2
TOWEL OR 17X24 6PK STRL BLUE (TOWEL DISPOSABLE) ×6 IMPLANT
TUBE ANAEROBIC SPECIMEN COL (MISCELLANEOUS) IMPLANT

## 2015-04-12 NOTE — Transfer of Care (Signed)
Immediate Anesthesia Transfer of Care Note  Patient: Veronica Rojas  Procedure(s) Performed: Procedure(s): PARS PLANA VITRECTOMY 25 GAUGE FOR ENDOPHTHALMITIS/VITREOUS TAP/INJECTION OF ANTIBOTICS, REVISION OF OPERATIVE WOUND, CONJUNCTIVA FLAP (Left)  Patient Location: PACU  Anesthesia Type:General  Level of Consciousness: awake, alert  and oriented  Airway & Oxygen Therapy: Patient Spontanous Breathing and Patient connected to nasal cannula oxygen  Post-op Assessment: Report given to RN and Post -op Vital signs reviewed and stable  Post vital signs: Reviewed and stable  Last Vitals: There were no vitals filed for this visit.  Complications: No apparent anesthesia complications

## 2015-04-12 NOTE — Anesthesia Postprocedure Evaluation (Signed)
Anesthesia Post Note  Patient: Veronica Rojas  Procedure(s) Performed: Procedure(s) (LRB): PARS PLANA VITRECTOMY 25 GAUGE FOR ENDOPHTHALMITIS/VITREOUS TAP/INJECTION OF ANTIBOTICS, REVISION OF OPERATIVE WOUND, CONJUNCTIVA FLAP (Left)  Patient location during evaluation: PACU Anesthesia Type: General Level of consciousness: awake and alert Pain management: pain level controlled Vital Signs Assessment: post-procedure vital signs reviewed and stable Respiratory status: spontaneous breathing Cardiovascular status: blood pressure returned to baseline Anesthetic complications: no    Last Vitals:  Filed Vitals:   04/12/15 2100 04/12/15 2115  BP: 141/66 99/86  Pulse: 65 64  Temp:  36.7 C  Resp: 12 11    Last Pain:  Filed Vitals:   04/12/15 2119  PainSc: 1                  Kennieth RadFitzgerald, Porche Steinberger E

## 2015-04-12 NOTE — H&P (Signed)
Patient has long-standing history of advanced glaucoma progressive despite maximum medical therapy and finally surgical therapy including glaucoma shunt, valve placement in the left eye.  She has 1 day onset of severe pain profound loss of vision worsened before. They have on examination by Dr. Alden HippGrote evidence of hypopyon and endophthalmitis of the left eye. It is suspected that some form of wound leak or dehiscence of the wound. Examination left eye discloses him motion vision. Right eye is 20/20 5. Left eye is hand motion.  Patient left eye discloses a 3+ injection of the conjunctiva with overall corneal haze present. Anterior chamber is deep formed with 2+ fibrin and 3+ white cells and a 1 mm hypopyon. Anterior's of vitreous is notable for 1+ white cells as well suspected therefore that this is a complete endophthalmitis.  B-scan ultrasonography discloses dense vitreous debris in the left eye however there is no retinal detachment.   Impression #1 acute endophthalmitis left eye with a hypopyon left eye.  Plan diagnostic vitreous tap, diagnostic vitrectomy, therapeutic vitrectomy, an injection therapeutic antibiotics intravitreally planning Fortaz 2.25 mg and vancomycin 1.0 mg left eye.

## 2015-04-12 NOTE — Brief Op Note (Signed)
Preoperative diagnosis: #1 endophthalmitis, acute left eye. #2 wound related infection left eye, previous glaucoma shunt superotemporal new  Postoperative diagnoses number 1-2 same  Procedure: #1 diagnostic and therapeutic vitrectomy left eye  #2 diagnostic vitreous tap, anterior chamber tap, left eye  #3 revision of operative wound with conjunctival plasty, conjunctival rotational flap, left eye.  Surgeon Jillyn HiddenGary a Sue Mcalexander M.D.

## 2015-04-12 NOTE — OR Nursing (Addendum)
Cultures to Lab:   Vitreous fluid for cassette Left eye for Cytospin, Routine Culture, StatGram stain,     Agar plates and aerobic culture tube - Anterior Chamber fluid tap Left eye

## 2015-04-12 NOTE — Op Note (Signed)
Preoperative diagnosis: #1 endophthalmitis, acute left eye. #2 wound related infection left eye, previous glaucoma shunt superotemporal new  Postoperative diagnoses number 1-2 same  Procedure: #1 diagnostic and therapeutic vitrectomy left eye  #2 diagnostic vitreous tap, anterior chamber tap, left eye  #3 revision of operative wound with conjunctival plasty, conjunctival rotational flap, left eye.  Surgeon Edmon CrapeGary a Rankin M.D.  Gen. endotracheal anesthesia  Indication for procedure: Patient is a long-standing history of advanced glaucoma requiring multiple surgical interventions in maximal tolerated medical therapy. She had a glaucoma shunt valve placement superotemporal quadrant. She has developed apparent endophthalmitis with hypopyon fibrin deposition and vitrectomy tried this is well of the left eye the last 24 hours. Semination as well as B-scan ultrasonography confirmed the presence of significant vitreous opacification and the company with profound vision loss and the hypopyon patient suspected having acute endophthalmitis possibly wound related via wound dehiscence.  He understands us in attempt to attend diagnosis the more poorly therapeutic vitrectomy to deliver and to deliver intravitreal antibiotics to left eye. She understands risk of anesthesia including the rare occurrence death but also to the eye from the underlying condition and the surgical intervention including but not limited to hemorrhage, infection, scarring, need another surgery, no change of vision, loss of vision and progression of disease despite intervention.  At appropriate signed consent was obtained patient taken to the operating room. Operating room general checkup anesthesia instituted difficulty. Surgical timeout carried out with the OR staff and surgeon operative site of the left eye that identified. Thereafter I was sterilely prepped and draped usual fashion. Second surgical timeout was carried out with staff and  surgeon. At this time lid speculum applied. 25-gauge trocar placed initially in the inferotemporal quadrant. Patient verified visually. 30-gauge needle was then used to create an anterior chamber tap diagnostic aspirate. This is plated directly to blood auger intraocular plates as well as transport medium.  At this time spear trochars were applied. Limited core vitrectomy was then begun. Pupillary fibrin was noted which limited posterior visualization. This reason I elected to remove the superonasal temporal trocar and to replace it into the anterior chamber. Then switch the infusion site the anterior chamber plan under low effusion pressures was able to create a paracentesis incision separately and using the vitrectomy is mentation 25-gauge is able to engage pupillary fibrin membrane as well as fibrin that was emanating from the superotemporal tube entry site and in the anterior chamber angle. This clear the visual axis nicely. Eye from a posterior approach greatest very small inferior peripheral iridectomy to allow for equilibration of the infusion pressures thus maintain the infusion the anterior chamber and to maintain clear visualization. At this time I was able to then approach up via the inferotemporal light.lighted instrument as well as the superonasal vitrectomy management to continue core vitrectomy clear the visual axis. Notable findings posteriorly whitening out of retinal vasculature which was completely whited out with margination of white cells. Retinal hemorrhage was also noted. The optic nerve is of obscured by white cells.  This time and she was removed from the eye. The inferotemporal and the superonasal trochars were removed from the eye. Secure. I left the anterior chamber infusion in place under low effusion pressures and I could see that there was a gap at the eye conjunctival flexion near the corneal limbal region allowed for exposure of the tube entering the anterior chamber. At this  level at this time I elected to create a reflection and dissection the conjunctival  near limbus in the entire superotemporal quadrant. I open the timing limited peritomy and the superotemporal quadrant and dissected posteriorly. Roundness thin sheet of tissue covering the tube posteriorly. This was not secured at this time and I was able to irrigate this area anion secure it overlying the to begin with vertical mattress 7-0 Vicryl suture posterior to limbus to continue the current create a coverage for the tube to insertion into the anterior chamber. I elected to not remove the tube. After mobilization the conjunctiva rotating the conjunctival temporally and scar 5 the peripheral corneal epithelium onto gray and in clear cornea. Allow me to advance the conjunctiva onto the clear cornea adequate coverage of the insertion of the tube into its current canal. The conjunctiva was then secured in a vertical mattress fashion to through to the clear cornea advancing over the tube insertion site. The lateral wings of the conjunctiva were closed with vertical mattress 7-0 Vicryl suture to the conjunctiva and sclera. Created a secure watertight closure. Was now infusion site of the intrachamber was now removed and there is no spontaneous egress of fluid noted in this fashion. This time a globe with appropriate tension I did place the intravitreal antibiotics  0.1 mL of vancomycin, 1 mg. And 0.1 mL of Fortaz, 2.25 mg into the vitreous cavity. I injected additional antibiotics inferiorly and subchondral Tylenol fashion.  Indeterminate the wound was secure. The intraocular pressures appropriate. I remove the lid speculum. Counts are appropriate. Sterile patch function applied gently applied to the eye. Patient taken the PACU in good stable condition hemodynamically. She'll be sent home as an outpatient later tonight after completion of  intravenous anabiotic.

## 2015-04-12 NOTE — Anesthesia Procedure Notes (Signed)
Procedure Name: Intubation Date/Time: 04/12/2015 6:30 PM Performed by: Arlice ColtMANESS, Lijah Bourque B Pre-anesthesia Checklist: Patient identified, Emergency Drugs available, Suction available, Patient being monitored and Timeout performed Patient Re-evaluated:Patient Re-evaluated prior to inductionOxygen Delivery Method: Circle system utilized Preoxygenation: Pre-oxygenation with 100% oxygen Intubation Type: IV induction and Rapid sequence Laryngoscope Size: Miller and 3 Grade View: Grade II Tube type: Oral Tube size: 7.5 mm Number of attempts: 1 Airway Equipment and Method: Stylet Placement Confirmation: ETT inserted through vocal cords under direct vision,  positive ETCO2 and breath sounds checked- equal and bilateral Secured at: 21 cm Tube secured with: Tape Dental Injury: Teeth and Oropharynx as per pre-operative assessment

## 2015-04-12 NOTE — Anesthesia Preprocedure Evaluation (Addendum)
Anesthesia Evaluation  Patient identified by MRN, date of birth, ID band Patient awake    Reviewed: Allergy & Precautions, NPO status , Patient's Chart, lab work & pertinent test results  Airway Mallampati: II  TM Distance: >3 FB Neck ROM: Full    Dental no notable dental hx.    Pulmonary neg pulmonary ROS,    Pulmonary exam normal breath sounds clear to auscultation       Cardiovascular negative cardio ROS Normal cardiovascular exam Rhythm:Regular Rate:Normal     Neuro/Psych negative neurological ROS  negative psych ROS   GI/Hepatic negative GI ROS, Neg liver ROS,   Endo/Other  negative endocrine ROS  Renal/GU negative Renal ROS     Musculoskeletal negative musculoskeletal ROS (+)   Abdominal   Peds  Hematology negative hematology ROS (+)   Anesthesia Other Findings   Reproductive/Obstetrics negative OB ROS                             Anesthesia Physical Anesthesia Plan  ASA: II and emergent  Anesthesia Plan: General   Post-op Pain Management:    Induction: Intravenous, Rapid sequence and Cricoid pressure planned  Airway Management Planned: Oral ETT  Additional Equipment:   Intra-op Plan:   Post-operative Plan: Extubation in OR  Informed Consent: I have reviewed the patients History and Physical, chart, labs and discussed the procedure including the risks, benefits and alternatives for the proposed anesthesia with the patient or authorized representative who has indicated his/her understanding and acceptance.   Dental advisory given  Plan Discussed with: CRNA  Anesthesia Plan Comments:        Anesthesia Quick Evaluation

## 2015-04-13 ENCOUNTER — Encounter (HOSPITAL_COMMUNITY): Payer: Self-pay | Admitting: Ophthalmology

## 2015-04-20 LAB — EYE CULTURE
CULTURE: NO GROWTH
Culture: NO GROWTH

## 2015-06-06 ENCOUNTER — Other Ambulatory Visit: Payer: Self-pay | Admitting: Family Medicine

## 2015-06-06 DIAGNOSIS — Z1231 Encounter for screening mammogram for malignant neoplasm of breast: Secondary | ICD-10-CM

## 2015-06-06 DIAGNOSIS — E2839 Other primary ovarian failure: Secondary | ICD-10-CM

## 2015-06-21 ENCOUNTER — Other Ambulatory Visit: Payer: Self-pay | Admitting: Family Medicine

## 2015-06-21 ENCOUNTER — Ambulatory Visit
Admission: RE | Admit: 2015-06-21 | Discharge: 2015-06-21 | Disposition: A | Discharge status: Home / Self Care | Payer: Medicare Other | Source: Ambulatory Visit | Attending: Family Medicine | Admitting: Family Medicine

## 2015-06-21 DIAGNOSIS — M8580 Other specified disorders of bone density and structure, unspecified site: Secondary | ICD-10-CM

## 2015-06-21 DIAGNOSIS — Z1231 Encounter for screening mammogram for malignant neoplasm of breast: Secondary | ICD-10-CM

## 2015-07-12 ENCOUNTER — Ambulatory Visit (INDEPENDENT_AMBULATORY_CARE_PROVIDER_SITE_OTHER): Payer: Medicare Other | Admitting: Ophthalmology

## 2015-07-12 DIAGNOSIS — H35341 Macular cyst, hole, or pseudohole, right eye: Secondary | ICD-10-CM | POA: Diagnosis not present

## 2016-06-26 ENCOUNTER — Other Ambulatory Visit: Payer: Self-pay | Admitting: Hematology and Oncology

## 2016-06-26 ENCOUNTER — Other Ambulatory Visit: Payer: Self-pay | Admitting: Family Medicine

## 2016-06-26 DIAGNOSIS — Z1231 Encounter for screening mammogram for malignant neoplasm of breast: Secondary | ICD-10-CM

## 2016-07-11 ENCOUNTER — Ambulatory Visit (INDEPENDENT_AMBULATORY_CARE_PROVIDER_SITE_OTHER): Payer: Medicare Other | Admitting: Ophthalmology

## 2016-07-17 ENCOUNTER — Ambulatory Visit (INDEPENDENT_AMBULATORY_CARE_PROVIDER_SITE_OTHER): Payer: Medicare Other | Admitting: Ophthalmology

## 2016-09-09 ENCOUNTER — Ambulatory Visit
Admission: RE | Admit: 2016-09-09 | Discharge: 2016-09-09 | Disposition: A | Discharge status: Home / Self Care | Payer: Medicare Other | Source: Ambulatory Visit | Attending: Family Medicine | Admitting: Family Medicine

## 2016-09-09 DIAGNOSIS — Z1231 Encounter for screening mammogram for malignant neoplasm of breast: Secondary | ICD-10-CM

## 2017-02-28 NOTE — Pre-Procedure Instructions (Signed)
Veronica Rojas  02/28/2017      [facility] [address] - [place], [place] - [address] [place] [address] Phone: [phone] Fax: [phone]    Your procedure is scheduled on Tuesday, October 30th   [facility] at 11:00am   Call this number if you have problems [facility]:  [phone].   Remember:              4-5 days prior to surgery, STOP TAKING any Vitamins, Herbal Supplements, Anti-inflammatories.   Do not eat food or drink liquids after midnight Monday.   Take these medicines the morning of surgery with A SIP OF WATER : Celexa, Pantoprazole.   Do not wear jewelry, make-up or nail polish.  Do not wear lotions, powders,  perfumes, or deoderant.  Do not shave 48 hours prior to surgery.    Do not bring valuables to the hospital.  [facility] is not responsible for any belongings or valuables.  Contacts, dentures or bridgework may not be worn into surgery.  Leave your suitcase in the car.  After surgery it may be brought to your room. For patients admitted to the hospital, discharge time will be determined by your treatment team.  Patients discharged the day of surgery will not be allowed to drive home and will need someone to stay with you for the first 24 hrs.  Please read over the following fact sheets that you were given. Pain Booklet and Surgical Site Infection Prevention       [place]- Preparing For Surgery  Before surgery, you can play an important role. Because skin is not sterile, your skin needs to be as free of germs as possible. You can reduce the number of germs on your skin by washing with CHG (chlorahexidine gluconate) Soap before surgery.  CHG is an antiseptic cleaner which kills germs and bonds with the skin to continue killing germs even after washing.  Please do not use if you have an allergy to CHG or antibacterial soaps. If your skin becomes reddened/irritated stop  using the CHG.  Do not shave (including legs and underarms) for at least 48 hours prior to first CHG shower. It is OK to shave your face.  Please follow these instructions carefully.   1. Shower the NIGHT BEFORE SURGERY and the MORNING OF SURGERY with CHG.   2. If you chose to wash your hair, wash your hair first as usual with your normal shampoo.  3. After you shampoo, rinse your hair and body thoroughly to remove the shampoo.  4. Use CHG as you would any other liquid soap. You can apply CHG directly to the skin and wash gently with a scrungie or a clean washcloth.   5. Apply the CHG Soap to your body ONLY FROM THE NECK DOWN.  Do not use on open wounds or open sores. Avoid contact with your eyes, ears, mouth and genitals (private parts). Wash Face and genitals (private parts)  with your normal soap.  6. Wash thoroughly, paying special attention to the area where your surgery will be performed.  7. Thoroughly rinse your body with warm water from the neck down.  8. DO NOT shower/wash with your normal soap after using and rinsing off the CHG Soap.  9. Pat yourself dry with a CLEAN TOWEL.  10. Wear CLEAN PAJAMAS to bed the night before surgery, wear comfortable clothes the morning of surgery  11.  Place CLEAN SHEETS on your bed the night of your first shower and DO NOT SLEEP WITH PETS.    Day of Surgery: Do not apply any deodorants/lotions. Please wear clean clothes to the hospital/surgery center.

## 2017-03-03 ENCOUNTER — Encounter (HOSPITAL_COMMUNITY)
Admission: RE | Admit: 2017-03-03 | Discharge: 2017-03-03 | Disposition: A | Discharge status: Home / Self Care | Payer: Medicare Other | Source: Ambulatory Visit | Attending: Orthopaedic Surgery | Admitting: Orthopaedic Surgery

## 2017-03-03 ENCOUNTER — Encounter (HOSPITAL_COMMUNITY): Payer: Self-pay

## 2017-03-03 DIAGNOSIS — F419 Anxiety disorder, unspecified: Secondary | ICD-10-CM | POA: Diagnosis not present

## 2017-03-03 DIAGNOSIS — Z79899 Other long term (current) drug therapy: Secondary | ICD-10-CM | POA: Diagnosis not present

## 2017-03-03 DIAGNOSIS — F329 Major depressive disorder, single episode, unspecified: Secondary | ICD-10-CM | POA: Diagnosis not present

## 2017-03-03 DIAGNOSIS — S83281A Other tear of lateral meniscus, current injury, right knee, initial encounter: Secondary | ICD-10-CM | POA: Diagnosis not present

## 2017-03-03 DIAGNOSIS — Y9289 Other specified places as the place of occurrence of the external cause: Secondary | ICD-10-CM | POA: Diagnosis not present

## 2017-03-03 DIAGNOSIS — X58XXXA Exposure to other specified factors, initial encounter: Secondary | ICD-10-CM | POA: Diagnosis not present

## 2017-03-03 DIAGNOSIS — M2241 Chondromalacia patellae, right knee: Secondary | ICD-10-CM | POA: Diagnosis not present

## 2017-03-03 DIAGNOSIS — S83231A Complex tear of medial meniscus, current injury, right knee, initial encounter: Secondary | ICD-10-CM | POA: Diagnosis not present

## 2017-03-03 DIAGNOSIS — K219 Gastro-esophageal reflux disease without esophagitis: Secondary | ICD-10-CM | POA: Diagnosis not present

## 2017-03-03 DIAGNOSIS — H409 Unspecified glaucoma: Secondary | ICD-10-CM | POA: Diagnosis not present

## 2017-03-03 HISTORY — DX: Unspecified glaucoma: H40.9

## 2017-03-03 HISTORY — DX: Gastro-esophageal reflux disease without esophagitis: K21.9

## 2017-03-03 HISTORY — DX: Macular cyst, hole, or pseudohole, bilateral: H35.343

## 2017-03-03 HISTORY — DX: Anxiety disorder, unspecified: F41.9

## 2017-03-03 HISTORY — DX: Acquired absence of eye: Z90.01

## 2017-03-03 HISTORY — DX: Depression, unspecified: F32.A

## 2017-03-03 HISTORY — DX: Major depressive disorder, single episode, unspecified: F32.9

## 2017-03-03 LAB — BASIC METABOLIC PANEL
Anion gap: 8 (ref 5–15)
BUN: 8 mg/dL (ref 6–20)
CHLORIDE: 102 mmol/L (ref 101–111)
CO2: 27 mmol/L (ref 22–32)
CREATININE: 0.82 mg/dL (ref 0.44–1.00)
Calcium: 9.2 mg/dL (ref 8.9–10.3)
GFR calc non Af Amer: >60 mL/min (ref >60)
GLUCOSE: 102 mg/dL — AB (ref 65–99)
Potassium: 4.3 mmol/L (ref 3.5–5.1)
Sodium: 137 mmol/L (ref 135–145)

## 2017-03-03 LAB — CBC
HEMATOCRIT: 43.5 % (ref 36.0–46.0)
HEMOGLOBIN: 14.6 g/dL (ref 12.0–15.0)
MCH: 31.5 pg (ref 26.0–34.0)
MCHC: 33.6 g/dL (ref 30.0–36.0)
MCV: 94 fL (ref 78.0–100.0)
Platelets: 246 10*3/uL (ref 150–400)
RBC: 4.63 MIL/uL (ref 3.87–5.11)
RDW: 13.3 % (ref 11.5–15.5)
WBC: 7.2 10*3/uL (ref 4.0–10.5)

## 2017-03-03 MED ORDER — LACTATED RINGERS IV SOLN
INTRAVENOUS | Status: DC
  Administered 2017-03-04: 50 mL/h via INTRAVENOUS

## 2017-03-03 MED ORDER — CEFAZOLIN SODIUM-DEXTROSE 2-4 GM/100ML-% IV SOLN
2.0000 g | INTRAVENOUS | Status: AC
  Administered 2017-03-04: 2 g via INTRAVENOUS
  Filled 2017-03-03: qty 100

## 2017-03-03 NOTE — Progress Notes (Signed)
Denies heart issues, no murmur, no cardiac tests. PCP is Kathee DeltonKris Kaplan, PA  LOV  05/2016 Has had 10 plus surgeries on left eye.  Now has left prosthesis.

## 2017-03-03 NOTE — H&P (Signed)
Veronica Rojas is an 71 y.o. female.   Chief Complaint: Right knee pain HPI: Veronica continues with some terrible right knee pain.  It has been going on for 2-1/2 months at this point.  She cannot sleep at night and cannot walk very far.  Twisting is terribly painful.  She is here with her husband.  She has been through an MRI scan since the last time she was in.    MRI:  I reviewed an MRI scan films and report of a study done at the [facility] on 02/23/17.  They read meniscocapsular junction edema but on the sagittal view it looks like a clear medial meniscus tear full thickness in the posterior horn.  She also has some mild degenerative changes.  Past Medical History:  Diagnosis Date  . Anxiety   . Depression   . GERD (gastroesophageal reflux disease)   . Glaucoma    left eye got bacterial infection  . 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 Macular hole of both eyes     Past Surgical History:  Procedure Laterality Date  . ABDOMINAL HYSTERECTOMY    . CHOLECYSTECTOMY    . EYE SURGERY     pt reports 10 eye surgeries  . PARS PLANA VITRECTOMY Left 04/12/2015   Procedure: PARS PLANA VITRECTOMY 25 GAUGE FOR ENDOPHTHALMITIS/VITREOUS TAP/INJECTION OF ANTIBOTICS, REVISION OF OPERATIVE WOUND, CONJUNCTIVA FLAP;  Surgeon: Edmon Crape, MD;  Location: [facility] OR;  Service: Ophthalmology;  Laterality: Left;  . TUBAL LIGATION      Family History  Problem Relation Age of Onset  . Breast cancer Neg Hx    Social History:  reports that she has never smoked. She has never used smokeless tobacco. She reports that she drinks alcohol. She reports that she does not use drugs.  Allergies:  Allergies  Allergen Reactions  . Codeine Hives  . Celebrex [Celecoxib] Rash and Other (See Comments)    Rash all over head  . Sulfa Antibiotics Rash and Other (See Comments)    Rash all over the head    No prescriptions prior to admission.    Results for orders placed or  performed during the hospital encounter of 03/03/17 (from the past 48 hour(s))  CBC     Status: None   Collection Time: 03/03/17  1:30 PM  Result Value Ref Range   WBC 7.2 4.0 - 10.5 K/uL   RBC 4.63 3.87 - 5.11 MIL/uL   Hemoglobin 14.6 12.0 - 15.0 g/dL   HCT 55.2 79.2 - 33.4 %   MCV 94.0 78.0 - 100.0 fL   MCH 31.5 26.0 - 34.0 pg   MCHC 33.6 30.0 - 36.0 g/dL   RDW 16.2 25.2 - 76.1 %   Platelets 246 150 - 400 K/uL  Basic metabolic panel     Status: Abnormal   Collection Time: 03/03/17  1:30 PM  Result Value Ref Range   Sodium 137 135 - 145 mmol/L   Potassium 4.3 3.5 - 5.1 mmol/L   Chloride 102 101 - 111 mmol/L   CO2 27 22 - 32 mmol/L   Glucose, Bld 102 (H) 65 - 99 mg/dL   BUN 8 6 - 20 mg/dL   Creatinine, Ser 9.59 0.44 - 1.00 mg/dL   Calcium 9.2 8.9 - 36.2 mg/dL   GFR calc non Af Amer >60 >60 mL/min   GFR calc Af Amer >60 >60 mL/min    Comment: (NOTE) The eGFR  has been calculated using the CKD EPI equation. This calculation has not been validated in all clinical situations. eGFR's persistently <60 mL/min signify possible Chronic Kidney Disease.    Anion gap 8 5 - 15   No results found.  Review of Systems  Musculoskeletal: Positive for joint pain.       Right knee  All other systems reviewed and are negative.   There were no vitals taken for this visit. Physical Exam  Constitutional: She is oriented to person, place, and time. She appears well-developed and well-nourished.  HENT:  Head: Normocephalic and atraumatic.  Eyes: Pupils are equal, round, and reactive to light.  Neck: Normal range of motion.  Cardiovascular: Normal rate.   Respiratory: Effort normal.  GI: Soft.  Musculoskeletal:  Right knee has trace effusion.  Her motion is about 0-120 at which point she has significant pain.  She has pain along the medial joint line to palpation.  McMurray's test is painful in the medial direction.   Hip motion is full and pain free and SLR is negative on both sides.   There is no palpable LAD behind either knee.  Sensation and motor function are intact on both sides and there are palpable pulses on both sides.  Neurological: She is alert and oriented to person, place, and time.  Skin: Skin is warm and dry.  Psychiatric: She has a normal mood and affect. Her behavior is normal. Judgment and thought content normal.     Assessment/Plan Assessment: Right knee torn medial meniscus with MRI 2018 injected 12/25/16  Plan: [place] has some terrible pain.  By my reading of the MRI she has a medial meniscus tear.  She failed an injection and the test of time. I reviewed risks of anesthesia and infection as well as potential for DVT related to a knee arthroscopy.  I've stressed the importance of some postoperative physical therapy to optimize results and we will try to set up an appointment.  Two to four  weeks for recovery would be typical but that is a little variable.    Rennie Rouch, Larwance Sachs, PA-C 03/03/2017, 2:49 PM

## 2017-03-04 ENCOUNTER — Encounter (HOSPITAL_COMMUNITY)
Admission: RE | Disposition: A | Discharge status: Home / Self Care | Payer: Self-pay | Source: Ambulatory Visit | Attending: Orthopaedic Surgery

## 2017-03-04 ENCOUNTER — Ambulatory Visit (HOSPITAL_COMMUNITY)
Admission: RE | Admit: 2017-03-04 | Discharge: 2017-03-04 | Disposition: A | Discharge status: Home / Self Care | Payer: Medicare Other | Source: Ambulatory Visit | Attending: Orthopaedic Surgery | Admitting: Orthopaedic Surgery

## 2017-03-04 ENCOUNTER — Ambulatory Visit (HOSPITAL_COMMUNITY): Payer: Medicare Other | Admitting: Certified Registered"

## 2017-03-04 DIAGNOSIS — F329 Major depressive disorder, single episode, unspecified: Secondary | ICD-10-CM | POA: Insufficient documentation

## 2017-03-04 DIAGNOSIS — K219 Gastro-esophageal reflux disease without esophagitis: Secondary | ICD-10-CM | POA: Diagnosis not present

## 2017-03-04 DIAGNOSIS — Z79899 Other long term (current) drug therapy: Secondary | ICD-10-CM | POA: Insufficient documentation

## 2017-03-04 DIAGNOSIS — S83281A Other tear of lateral meniscus, current injury, right knee, initial encounter: Secondary | ICD-10-CM | POA: Insufficient documentation

## 2017-03-04 DIAGNOSIS — H409 Unspecified glaucoma: Secondary | ICD-10-CM | POA: Insufficient documentation

## 2017-03-04 DIAGNOSIS — F419 Anxiety disorder, unspecified: Secondary | ICD-10-CM | POA: Insufficient documentation

## 2017-03-04 DIAGNOSIS — S83231A Complex tear of medial meniscus, current injury, right knee, initial encounter: Secondary | ICD-10-CM | POA: Diagnosis not present

## 2017-03-04 DIAGNOSIS — M2241 Chondromalacia patellae, right knee: Secondary | ICD-10-CM | POA: Diagnosis not present

## 2017-03-04 DIAGNOSIS — Y9289 Other specified places as the place of occurrence of the external cause: Secondary | ICD-10-CM | POA: Insufficient documentation

## 2017-03-04 DIAGNOSIS — X58XXXA Exposure to other specified factors, initial encounter: Secondary | ICD-10-CM | POA: Insufficient documentation

## 2017-03-04 HISTORY — PX: KNEE ARTHROSCOPY: SHX127

## 2017-03-04 SURGERY — ARTHROSCOPY, KNEE
Anesthesia: General | Site: Knee | Laterality: Right

## 2017-03-04 MED ORDER — FENTANYL CITRATE (PF) 100 MCG/2ML IJ SOLN: 25.0000 ug | INTRAMUSCULAR | Status: DC | PRN

## 2017-03-04 MED ORDER — PROPOFOL 10 MG/ML IV BOLUS
INTRAVENOUS | Status: DC | PRN
  Administered 2017-03-04: 150 mg via INTRAVENOUS

## 2017-03-04 MED ORDER — ONDANSETRON HCL 4 MG/2ML IJ SOLN
INTRAMUSCULAR | Status: DC | PRN
  Administered 2017-03-04: 4 mg via INTRAVENOUS

## 2017-03-04 MED ORDER — LIDOCAINE HCL (CARDIAC) 20 MG/ML IV SOLN
INTRAVENOUS | Status: DC | PRN
  Administered 2017-03-04: 100 mg via INTRAVENOUS

## 2017-03-04 MED ORDER — FENTANYL CITRATE (PF) 250 MCG/5ML IJ SOLN
INTRAMUSCULAR | Status: AC
  Filled 2017-03-04: qty 5

## 2017-03-04 MED ORDER — FENTANYL CITRATE (PF) 250 MCG/5ML IJ SOLN
INTRAMUSCULAR | Status: DC | PRN
  Administered 2017-03-04 (×3): 50 ug via INTRAVENOUS

## 2017-03-04 MED ORDER — PROPOFOL 10 MG/ML IV BOLUS
INTRAVENOUS | Status: AC
  Filled 2017-03-04: qty 20

## 2017-03-04 MED ORDER — BUPIVACAINE-EPINEPHRINE (PF) 0.5% -1:200000 IJ SOLN
INTRAMUSCULAR | Status: AC
  Filled 2017-03-04: qty 30

## 2017-03-04 MED ORDER — HYDROCODONE-ACETAMINOPHEN 5-325 MG PO TABS
1.0000 | ORAL_TABLET | Freq: Once | ORAL | Status: AC
  Administered 2017-03-04: 1 via ORAL

## 2017-03-04 MED ORDER — METHYLPREDNISOLONE ACETATE 80 MG/ML IJ SUSP
INTRAMUSCULAR | Status: AC
  Filled 2017-03-04: qty 1

## 2017-03-04 MED ORDER — HYDROCODONE-ACETAMINOPHEN 5-325 MG PO TABS: 1.0000 | ORAL_TABLET | ORAL | 0 refills | Status: AC | PRN

## 2017-03-04 MED ORDER — EPINEPHRINE PF 1 MG/ML IJ SOLN
INTRAMUSCULAR | Status: DC | PRN
  Administered 2017-03-04: 2 mg
  Administered 2017-03-04: 1 mg

## 2017-03-04 MED ORDER — HYDROCODONE-ACETAMINOPHEN 5-325 MG PO TABS
ORAL_TABLET | ORAL | Status: AC
  Administered 2017-03-04: 1 via ORAL
  Filled 2017-03-04: qty 1

## 2017-03-04 MED ORDER — SODIUM CHLORIDE 0.9 % IR SOLN
Status: DC | PRN
  Administered 2017-03-04 (×2): 3000 mL

## 2017-03-04 MED ORDER — BUPIVACAINE-EPINEPHRINE 0.5% -1:200000 IJ SOLN
INTRAMUSCULAR | Status: DC | PRN
  Administered 2017-03-04: 15 mL

## 2017-03-04 MED ORDER — EPHEDRINE SULFATE 50 MG/ML IJ SOLN
INTRAMUSCULAR | Status: DC | PRN
  Administered 2017-03-04: 10 mg via INTRAVENOUS

## 2017-03-04 MED ORDER — LACTATED RINGERS IV SOLN
INTRAVENOUS | Status: DC | PRN
  Administered 2017-03-04: 13:00:00 via INTRAVENOUS

## 2017-03-04 MED ORDER — EPINEPHRINE PF 1 MG/ML IJ SOLN
INTRAMUSCULAR | Status: AC
  Filled 2017-03-04: qty 2

## 2017-03-04 MED ORDER — CHLORHEXIDINE GLUCONATE 4 % EX LIQD: 60.0000 mL | Freq: Once | CUTANEOUS | Status: DC

## 2017-03-04 MED ORDER — METHYLPREDNISOLONE ACETATE 80 MG/ML IJ SUSP
INTRAMUSCULAR | Status: DC | PRN
  Administered 2017-03-04: 80 mg via INTRA_ARTICULAR

## 2017-03-04 MED ORDER — DEXAMETHASONE SODIUM PHOSPHATE 10 MG/ML IJ SOLN
INTRAMUSCULAR | Status: DC | PRN
  Administered 2017-03-04: 10 mg via INTRAVENOUS

## 2017-03-04 SURGICAL SUPPLY — 38 items
BANDAGE ACE 6X5 VEL STRL LF (GAUZE/BANDAGES/DRESSINGS) ×2 IMPLANT
BLADE CUTTER GATOR 3.5 (BLADE) ×2 IMPLANT
BLADE GREAT WHITE 4.2 (BLADE) ×2 IMPLANT
BLADE SURG 11 STRL SS (BLADE) IMPLANT
BNDG GAUZE ELAST 4 BULKY (GAUZE/BANDAGES/DRESSINGS) ×2 IMPLANT
COVER SURGICAL LIGHT HANDLE (MISCELLANEOUS) ×2 IMPLANT
CUFF TOURNIQUET SINGLE 34IN LL (TOURNIQUET CUFF) IMPLANT
CUFF TOURNIQUET SINGLE 44IN (TOURNIQUET CUFF) IMPLANT
DRAPE ARTHROSCOPY W/POUCH 114 (DRAPES) ×2 IMPLANT
DRAPE HALF SHEET 40X57 (DRAPES) ×2 IMPLANT
DRAPE U-SHAPE 47X51 STRL (DRAPES) ×2 IMPLANT
DRSG EMULSION OIL 3X3 NADH (GAUZE/BANDAGES/DRESSINGS) ×2 IMPLANT
DRSG PAD ABDOMINAL 8X10 ST (GAUZE/BANDAGES/DRESSINGS) ×2 IMPLANT
DURAPREP 26ML APPLICATOR (WOUND CARE) ×2 IMPLANT
GAUZE SPONGE 4X4 12PLY STRL (GAUZE/BANDAGES/DRESSINGS) ×2 IMPLANT
GAUZE SPONGE 4X4 12PLY STRL LF (GAUZE/BANDAGES/DRESSINGS) ×2 IMPLANT
GLOVE BIO SURGEON STRL SZ8 (GLOVE) ×8 IMPLANT
GLOVE BIOGEL PI IND STRL 8 (GLOVE) ×1 IMPLANT
GLOVE BIOGEL PI INDICATOR 8 (GLOVE) ×1
GOWN STRL REUS W/ TWL LRG LVL3 (GOWN DISPOSABLE) ×3 IMPLANT
GOWN STRL REUS W/ TWL XL LVL3 (GOWN DISPOSABLE) ×3 IMPLANT
GOWN STRL REUS W/TWL 2XL LVL3 (GOWN DISPOSABLE) ×2 IMPLANT
GOWN STRL REUS W/TWL LRG LVL3 (GOWN DISPOSABLE) ×3
GOWN STRL REUS W/TWL XL LVL3 (GOWN DISPOSABLE) ×3
KIT ROOM TURNOVER OR (KITS) ×2 IMPLANT
MANIFOLD NEPTUNE II (INSTRUMENTS) ×2 IMPLANT
NEEDLE 18GX1X1/2 (RX/OR ONLY) (NEEDLE) ×2 IMPLANT
NEEDLE 22X1 1/2 (OR ONLY) (NEEDLE) IMPLANT
NEEDLE SPNL 18GX3.5 QUINCKE PK (NEEDLE) IMPLANT
PACK ARTHROSCOPY DSU (CUSTOM PROCEDURE TRAY) ×2 IMPLANT
PAD ARMBOARD 7.5X6 YLW CONV (MISCELLANEOUS) ×4 IMPLANT
PAD CAST 4YDX4 CTTN HI CHSV (CAST SUPPLIES) ×1 IMPLANT
PADDING CAST COTTON 4X4 STRL (CAST SUPPLIES) ×1
SET ARTHROSCOPY TUBING (MISCELLANEOUS) ×1
SET ARTHROSCOPY TUBING LN (MISCELLANEOUS) ×1 IMPLANT
SYR CONTROL 10ML LL (SYRINGE) ×2 IMPLANT
TOWEL OR 17X24 6PK STRL BLUE (TOWEL DISPOSABLE) ×4 IMPLANT
WATER STERILE IRR 1000ML POUR (IV SOLUTION) ×2 IMPLANT

## 2017-03-04 NOTE — Transfer of Care (Signed)
Immediate Anesthesia Transfer of Care Note  Patient: Veronica Rojas  Procedure(s) Performed: ARTHROSCOPY KNEE (Right Knee)  Patient Location: PACU  Anesthesia Type:General  Level of Consciousness: awake, alert  and oriented  Airway & Oxygen Therapy: Patient Spontanous Breathing and Patient connected to face mask oxygen  Post-op Assessment: Report given to RN and Post -op Vital signs reviewed and stable  Post vital signs: Reviewed and stable  Last Vitals:  Vitals:   03/04/17 1107  BP: (!) 164/74  Pulse: 60  Resp: 20  Temp: 37 C  SpO2: 99%    Last Pain:  Vitals:   03/04/17 1123  TempSrc:   PainSc: 5       Patients Stated Pain Goal: 7 (03/04/17 1123)  Complications: No apparent anesthesia complications

## 2017-03-04 NOTE — Op Note (Signed)
#  [id number] 

## 2017-03-04 NOTE — Anesthesia Preprocedure Evaluation (Signed)
Anesthesia Evaluation  Patient identified by MRN, date of birth, ID band Patient awake    Reviewed: Allergy & Precautions, H&P , Patient's Chart, lab work & pertinent test results, reviewed documented beta blocker date and time   Airway Mallampati: II  TM Distance: >3 FB Neck ROM: full    Dental no notable dental hx.    Pulmonary    Pulmonary exam normal breath sounds clear to auscultation       Cardiovascular  Rhythm:regular Rate:Normal     Neuro/Psych    GI/Hepatic   Endo/Other    Renal/GU      Musculoskeletal   Abdominal   Peds  Hematology   Anesthesia Other Findings   Reproductive/Obstetrics                             Anesthesia Physical Anesthesia Plan  ASA: II  Anesthesia Plan: General   Post-op Pain Management:    Induction: Intravenous  PONV Risk Score and Plan:   Airway Management Planned: LMA  Additional Equipment:   Intra-op Plan:   Post-operative Plan:   Informed Consent: I have reviewed the patients History and Physical, chart, labs and discussed the procedure including the risks, benefits and alternatives for the proposed anesthesia with the patient or authorized representative who has indicated his/her understanding and acceptance.   Dental Advisory Given  Plan Discussed with: CRNA and Surgeon  Anesthesia Plan Comments: ( )        Anesthesia Quick Evaluation  

## 2017-03-04 NOTE — Anesthesia Procedure Notes (Signed)
Procedure Name: LMA Insertion Date/Time: 03/04/2017 1:14 PM Performed by: Veronica Rojas, Veronica Rojas Pre-anesthesia Checklist: Patient identified, Emergency Drugs available, Suction available, Patient being monitored and Timeout performed Patient Re-evaluated:Patient Re-evaluated prior to induction Oxygen Delivery Method: Circle system utilized Preoxygenation: Pre-oxygenation with 100% oxygen Induction Type: IV induction Ventilation: Mask ventilation without difficulty LMA: LMA inserted LMA Size: 4.0 Number of attempts: 1 Placement Confirmation: positive ETCO2 and breath sounds checked- equal and bilateral Dental Injury: Teeth and Oropharynx as per pre-operative assessment

## 2017-03-04 NOTE — Interval H&P Note (Signed)
History and Physical Interval Note:  03/04/2017 11:59 AM  Veronica Rojas  has presented today for surgery, with the diagnosis of RIGHT KNEE MEDIAL MENISCAL TEAR AND CHONDROMALACIA  The various methods of treatment have been discussed with the patient and family. After consideration of risks, benefits and other options for treatment, the patient has consented to  Procedure(s): ARTHROSCOPY KNEE (Right) as a surgical intervention .  The patient's history has been reviewed, patient examined, no change in status, stable for surgery.  I have reviewed the patient's chart and labs.  Questions were answered to the patient's satisfaction.     Arshi Duarte G

## 2017-03-05 ENCOUNTER — Encounter (HOSPITAL_COMMUNITY): Payer: Self-pay | Admitting: Orthopaedic Surgery

## 2017-03-05 NOTE — Op Note (Signed)
NAME:  Veronica Rojas, Veronica Rojas                   ACCOUNT NO.:  MEDICAL RECORD NO.:  [id number]  LOCATION:                                 FACILITY:  PHYSICIAN:  Lubertha Basqueeter G. Jerl Santosalldorf, M.D.     DATE OF BIRTH:  DATE OF PROCEDURE:  03/04/2017 DATE OF DISCHARGE:                              OPERATIVE REPORT   PREOPERATIVE DIAGNOSES: 1. Right knee torn medial meniscus. 2. Right knee chondromalacia.  POSTOPERATIVE DIAGNOSES: 1. Right knee torn medial and torn lateral meniscus. 2. Right knee chondromalacia patellofemoral and medial.  PROCEDURES: 1. Right knee partial medial and partial lateral meniscectomy. 2. Right knee chondroplasty and abrasion patellofemoral and medial.  ANESTHESIA:  General.  ATTENDING SURGEON:  Lubertha BasquePeter G. Jerl Santosalldorf, MD.  ASSISTANElodia Florence:  Andrew Nida, PA.  INDICATION FOR PROCEDURE:  The patient is a 71 year old woman with a long history of extreme right knee pain.  This has persisted despite multiple conservative measures.  By MRI scan, she has things which looks like a medial meniscus tear and some mild degeneration.  She has pain which limits her ability to rest and walk and she is offered an arthroscopy.  Informed operative consent was obtained after discussion of possible complications including reaction to anesthesia and infection.  SUMMARY OF FINDINGS AND PROCEDURE:  Under general anesthesia, an arthroscopy of the right knee was performed.  Suprapatellar pouch was benign while the patellofemoral joint exhibited some focal breakdown in the intertrochlear groove, grade 4, central.  This was addressed with chondroplasty and abrasion to bleeding bone in one small area.  Medial compartment exhibited some grade 3 change with very little grade 4 change.  Chondroplasties were done.  She did have a complex tear of the posterior and middle horn of the medial meniscus and about 10% partial medial meniscectomy was performed.  ACL looked intact.  The lateral compartment had no  degenerative change, but she did have a radial tear of the posterior horn, addressed with about a 5% partial lateral meniscectomy.  She was scheduled to go home same day.  DESCRIPTION OF PROCEDURE:  The patient was taken to the operating suite where general anesthetic was applied without difficulty.  She was positioned supine and prepped and draped in a normal sterile fashion. After administration of preop IV Kefzol and an appropriate time-out, an arthroscopy of the right knee was performed through a total of 2 portals.  Findings were as noted above.  Procedure consisted predominantly of the partial medial meniscectomy, which was done with basket and shaver back to stable tissues.  We also performed a partial lateral meniscectomy in a similar fashion.  I then performed the chondroplasties and abrasions as described above.  The knee was thoroughly irrigated followed by placement of Marcaine with epinephrine and morphine plus some Depo-Medrol.  Adaptic was placed over the portals followed by dry gauze and loose Ace wrap.  Estimated blood loss and intraoperative fluids can be obtained from anesthesia records.  No tourniquet was used.  DISPOSITION:  The patient was extubated in the operating room and taken to recovery in stable condition.  She was to go home same-day and follow up in the office closely.  I  will contact her by phone tonight.     Lubertha Basque Jerl Santos, M.D.     PGD/MEDQ  D:  03/04/2017  T:  03/05/2017  Job:  [id number]

## 2017-03-06 NOTE — Anesthesia Postprocedure Evaluation (Signed)
Anesthesia Post Note  Patient: [place] A Veras  Procedure(s) Performed: ARTHROSCOPY KNEE (Right Knee)     Patient location during evaluation: PACU Anesthesia Type: General Level of consciousness: awake and alert Pain management: pain level controlled Vital Signs Assessment: post-procedure vital signs reviewed and stable Respiratory status: spontaneous breathing, nonlabored ventilation, respiratory function stable and patient connected to nasal cannula oxygen Cardiovascular status: blood pressure returned to baseline and stable Postop Assessment: no apparent nausea or vomiting Anesthetic complications: no    Last Vitals:  Vitals:   03/04/17 1435 03/04/17 1445  BP: (!) 153/67   Pulse: 62 64  Resp: (!) 9 11  Temp:  36.7 C  SpO2: 96% 98%    Last Pain:  Vitals:   03/04/17 1505  TempSrc:   PainSc: 3                  Cuauhtemoc Huegel

## 2017-05-20 ENCOUNTER — Other Ambulatory Visit: Payer: Self-pay

## 2017-05-20 ENCOUNTER — Ambulatory Visit: Payer: Medicare Other | Attending: Orthopaedic Surgery | Admitting: Physical Therapy

## 2017-05-20 DIAGNOSIS — R2689 Other abnormalities of gait and mobility: Secondary | ICD-10-CM

## 2017-05-20 DIAGNOSIS — M25561 Pain in right knee: Secondary | ICD-10-CM | POA: Insufficient documentation

## 2017-05-20 NOTE — Therapy (Signed)
[facility] [address] [place], [place], [address] Phone: [phone]   Fax:  [phone]  Physical Therapy Evaluation  Patient Details  Name: Veronica Rojas MRN: [id number] Date of Birth: October 25, 1945 Referring Provider: Marcene CorningPeter Dalldorf MD   Encounter Date: 05/20/2017  PT End of Session - 05/20/17 1308    Visit Number  1    Number of Visits  18    Date for PT Re-Evaluation  07/01/17    PT Start Time  1309    PT Stop Time  1354    PT Time Calculation (min)  45 min    Activity Tolerance  Patient tolerated treatment well    Behavior During Therapy  [facility] for tasks assessed/performed       Past Medical History:  Diagnosis Date  . Anxiety   . Depression   . GERD (gastroesophageal reflux disease)   . Glaucoma    left eye got bacterial infection  . 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 Macular hole of both eyes     Past Surgical History:  Procedure Laterality Date  . ABDOMINAL HYSTERECTOMY    . CHOLECYSTECTOMY    . EYE SURGERY     pt reports 10 eye surgeries  . KNEE ARTHROSCOPY Right 03/04/2017   Procedure: ARTHROSCOPY KNEE;  Surgeon: Marcene Corningalldorf, Peter, MD;  [facility];  Service: Orthopedics;  Laterality: Right;  . PARS PLANA VITRECTOMY Left 04/12/2015   Procedure: PARS PLANA VITRECTOMY 25 GAUGE FOR ENDOPHTHALMITIS/VITREOUS TAP/INJECTION OF ANTIBOTICS, REVISION OF OPERATIVE WOUND, CONJUNCTIVA FLAP;  Surgeon: Edmon CrapeGary A Rankin, MD;  Location: [facility] OR;  Service: Ophthalmology;  Laterality: Left;  . TUBAL LIGATION      There were no vitals filed for this visit.   Subjective Assessment - 05/20/17 1314    Subjective  Patient had arthroscopic surgery on her R knee on 03/04/17 for torn meniscus. Patient has been walking and doing exercises at home, but it still doesn't feel just right. She complains of a dull ache intermittently and every night. Also her gait still has a little limp.    Patient  Stated Goals  to get rid of pain    Currently in Pain?  Yes    Pain Score  1     Pain Location  Knee    Pain Orientation  Right    Pain Descriptors / Indicators  Aching;Dull    Pain Type  Chronic pain    Pain Onset  More than a month ago    Pain Frequency  Intermittent    Aggravating Factors   prolonged walking    Pain Relieving Factors  rest    Effect of Pain on Daily Activities  gets worse by end of day         [facility] Assessment - 05/20/17 0001      Assessment   Medical Diagnosis  s/p R knee scope    Referring Provider  Marcene CorningPeter Dalldorf MD    Onset Date/Surgical Date  03/04/17    Next MD Visit  prn      Precautions   Precautions  None      Restrictions   Weight Bearing Restrictions  No      Balance Screen   Has the patient fallen in the past 6 months  Yes    How many times?  04 December 2016    Has the patient had a decrease in activity level because of a  fear of falling?   Yes    Is the patient reluctant to leave their home because of a fear of falling?   No      Home Environment   Living Environment  Private residence    Available Help at Discharge  Family    Type of Home  House    Home Access  Stairs to enter    Entrance Stairs-Number of Steps  6    Entrance Stairs-Rails  Can reach both    Home Layout  One level      Prior Function   Level of Independence  Independent    Vocation  Retired      Observation/Other Assessments   Focus on Therapeutic Outcomes (FOTO)   61% limited CL; goal 45%  CK      ROM / Strength   AROM / PROM / Strength  AROM;PROM;Strength      AROM   Overall AROM Comments  R knee flex 110; full ext      PROM   Overall PROM Comments  R knee flex 122 deg      Strength   Overall Strength Comments  grossly 5/5 BLE except R knee flexion 4+/5      Flexibility   Soft Tissue Assessment /Muscle Length  yes    Hamstrings  Mild Bil    Quadriceps  Moderate R quad     ITB  Tight R TFL on R; mod ITB tightness      Palpation   Patella  mobility  WNL    Palpation comment  tender along MCL and MJL      Special Tests   Other special tests  +Apley compression/distraction R knee      Ambulation/Gait   Ambulation/Gait  Yes    Ambulation/Gait Assistance  7: Independent    Ambulation Distance (Feet)  60 Feet    Assistive device  None    Gait Pattern  Decreased step length - left;Decreased stance time - right;Decreased weight shift to right    Ambulation Surface  Level    Pre-Gait Activities  tandem stance weight shifting to encourage toe off on RLE    Gait Comments  decreased terminal stance and toe off on RLE      Balance   Balance Assessed  Yes      Standardized Balance Assessment   Standardized Balance Assessment  -- SLS x 6 sec Bil             Objective measurements completed on examination: See above findings.      [facility] Adult PT Treatment/Exercise - 05/20/17 0001      Modalities   Modalities  Iontophoresis      Iontophoresis   Type of Iontophoresis  Dexamethasone    Location  R medial knee    Dose  1.0 ml    Time  4 hour patch             PT Education - 05/20/17 1504    Education provided  Yes    Education Details  HEP and iontophoresis education    Person(s) Educated  Patient    Methods  Explanation;Demonstration;Tactile cues;Handout    Comprehension  Verbalized understanding;Returned demonstration          PT Long Term Goals - 05/20/17 1514      PT LONG TERM GOAL #1   Title  I with HEP    Time  6    Period  Weeks    Status  New    Target Date  07/01/17      PT LONG TERM GOAL #2   Title  Patient to amb community distances with a normal gait pattern with pain 1/10 or less.    Time  6    Period  Weeks    Status  New      PT LONG TERM GOAL #3   Title  Patient to report decreased pain in the R knee with ADLS to 1/10 or less.    Time  6    Period  Weeks             Plan - 2017/06/03 1505    Clinical Impression Statement  Patient presents today for low complexity  evaluation for R knee pain s/p a scope for medial meniscal tear/debridement on 03/04/17. Patient stands with her R hip in ER and has gait deviations including decreased toe off on R. She is generally strong, but has flexibility deficits which may be contributing to her pain. She will benefit from skilled PT to address these deficits.    Clinical Presentation  Stable    Clinical Decision Making  Low    Rehab Potential  Excellent    PT Frequency  2x / week    PT Duration  6 weeks    PT Treatment/Interventions  ADLs/Self Care Home Management;Cryotherapy;Electrical Stimulation;Iontophoresis 4mg /ml Dexamethasone;Moist Heat;Ultrasound;Gait training;Therapeutic exercise;Balance training;Neuromuscular re-education;Patient/family education;Manual techniques;Taping;Dry needling    PT Next Visit Plan  Assess ionto, STW to R TFL, ITB, hip adductors, pes anserine muscles; gait training, functional strength/balance    PT Home Exercise Plan  supine supine IR; semi-tandem weight shifting for gait    Consulted and Agree with Plan of Care  Patient       Patient will benefit from skilled therapeutic intervention in order to improve the following deficits and impairments:  Abnormal gait, Pain, Decreased strength, Impaired flexibility  Visit Diagnosis: Acute pain of right knee - Plan: PT plan of care cert/re-cert  Other abnormalities of gait and mobility - Plan: PT plan of care cert/re-cert  G-Codes - 06-03-17 1517    Functional Assessment Tool Used (Outpatient Only)  Foto = 61% limited, CL    Functional Limitation  Other PT primary    Other PT Primary Current Status (Z6109)  At least 60 percent but less than 80 percent impaired, limited or restricted    Other PT Primary Goal Status (U0454)  At least 40 percent but less than 60 percent impaired, limited or restricted        Problem List There are no active problems to display for this patient.   Raynelle Fanning Rossie Bretado PT 06-03-17, 3:22 PM  [facility] [address] [place], [place], [address] Phone: [phone]   Fax:  [phone]  Name: [place] A Tostenson MRN: [id number] Date of Birth: 1945-12-30

## 2017-05-22 ENCOUNTER — Encounter: Payer: Self-pay | Admitting: Physical Therapy

## 2017-05-22 ENCOUNTER — Ambulatory Visit: Payer: Medicare Other | Admitting: Physical Therapy

## 2017-05-22 DIAGNOSIS — M25561 Pain in right knee: Secondary | ICD-10-CM | POA: Diagnosis not present

## 2017-05-22 DIAGNOSIS — R2689 Other abnormalities of gait and mobility: Secondary | ICD-10-CM

## 2017-05-22 NOTE — Therapy (Signed)
[facility] [address] [place], [place], [address] Phone: [phone]   Fax:  [phone]  Physical Therapy Treatment  Patient Details  Name: Veronica Rojas MRN: [id number] Date of Birth: [birth year] Referring Provider: Marcene Corning MD   Encounter Date: 05/22/2017  PT End of Session - 05/22/17 1508    Visit Number  2    Number of Visits  18    Date for PT Re-Evaluation  07/01/17    PT Start Time  1431    PT Stop Time  1514    PT Time Calculation (min)  43 min    Activity Tolerance  Patient tolerated treatment well    Behavior During Therapy  [facility] for tasks assessed/performed       Past Medical History:  Diagnosis Date  . Anxiety   . Depression   . GERD (gastroesophageal reflux disease)   . Glaucoma    left eye got bacterial infection  . 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 Macular hole of both eyes     Past Surgical History:  Procedure Laterality Date  . ABDOMINAL HYSTERECTOMY    . CHOLECYSTECTOMY    . EYE SURGERY     pt reports 10 eye surgeries  . KNEE ARTHROSCOPY Right 03/04/2017   Procedure: ARTHROSCOPY KNEE;  Surgeon: Marcene Corning, MD;  Location: [facility] OR;  Service: Orthopedics;  Laterality: Right;  . PARS PLANA VITRECTOMY Left 04/12/2015   Procedure: PARS PLANA VITRECTOMY 25 GAUGE FOR ENDOPHTHALMITIS/VITREOUS TAP/INJECTION OF ANTIBOTICS, REVISION OF OPERATIVE WOUND, CONJUNCTIVA FLAP;  Surgeon: Edmon Crape, MD;  Location: [facility] OR;  Service: Ophthalmology;  Laterality: Left;  . TUBAL LIGATION      There were no vitals filed for this visit.  Subjective Assessment - 05/22/17 1434    Subjective  Patient tolerated last treatment well and found relief after the ionto patch    Patient Stated Goals  to get rid of pain    Currently in Pain?  Yes    Pain Score  1     Pain Location  Knee    Pain Orientation  Right    Pain Descriptors / Indicators  Discomfort    Pain Type   Chronic pain    Pain Onset  More than a month ago    Pain Frequency  Intermittent    Aggravating Factors   prolong activity or bending knee    Pain Relieving Factors  rest                      [facility] Adult PT Treatment/Exercise - 05/22/17 0001      Exercises   Exercises  Knee/Hip      Knee/Hip Exercises: Aerobic   Stationary Bike  L1, monitored      Knee/Hip Exercises: Standing   Heel Raises  Both;20 reps    Terminal Knee Extension  Strengthening;Right;20 reps;Theraband    Theraband Level (Terminal Knee Extension)  Level 2 (Red)    Forward Step Up  Right;20 reps;Step Height: 4"      Knee/Hip Exercises: Seated   Long Arc Quad  Strengthening;Right;20 reps;Weights;Limitations    Long Arc Quad Weight  4 lbs.    Long [employer] Limitations  half range    Other Seated Knee/Hip Exercises  HS curl red t-band 3x10      Knee/Hip Exercises: Supine   Hip Adduction Isometric  Strengthening;Both;20 reps    Henreitta Leber  Strengthening;Both;20 reps    Straight Leg Raises  Right;Strengthening;20 reps    Other Supine Knee/Hip Exercises  right hip abd with red t-band x30      Iontophoresis   Type of Iontophoresis  Dexamethasone    Location  R medial knee    Dose  1.420ml (4hrs) 2 of 6    Time  8                  PT Long Term Goals - 05/20/17 1514      PT LONG TERM GOAL #1   Title  I with HEP    Time  6    Period  Weeks    Status  New    Target Date  07/01/17      PT LONG TERM GOAL #2   Title  Patient to amb community distances with a normal gait pattern with pain 1/10 or less.    Time  6    Period  Weeks    Status  New      PT LONG TERM GOAL #3   Title  Patient to report decreased pain in the R knee with ADLS to 1/10 or less.    Time  6    Period  Weeks            Plan - 05/22/17 1511    Clinical Impression Statement  Patient tolerated treatment well today. Patient reported no discomfort with exercises today and was able to progress with supine  to sitting and standing knee activities. Patient responded well to ionto and requested again. Patient progressing thus far and goals ongoing.     Rehab Potential  Excellent    PT Frequency  2x / week    PT Duration  6 weeks    PT Treatment/Interventions  ADLs/Self Care Home Management;Cryotherapy;Electrical Stimulation;Iontophoresis 4mg /ml Dexamethasone;Moist Heat;Ultrasound;Gait training;Therapeutic exercise;Balance training;Neuromuscular re-education;Patient/family education;Manual techniques;Taping;Dry needling    PT Next Visit Plan  Assess exercises and cont with POC for ionto, ther ex and  STW to R TFL, ITB, hip adductors, pes anserine muscles; gait training, functional strength/balance    Consulted and Agree with Plan of Care  Patient       Patient will benefit from skilled therapeutic intervention in order to improve the following deficits and impairments:  Abnormal gait, Pain, Decreased strength, Impaired flexibility  Visit Diagnosis: Acute pain of right knee  Other abnormalities of gait and mobility     Problem List There are no active problems to display for this patient.   Hermelinda DellenDUNFORD, Lutisha Knoche P, PTA 05/22/2017, 3:16 PM  [facility] [address] [place], [place], [address] Phone: [phone]   Fax:  [phone]  Name: Veronica Rojas MRN: [id number] Date of Birth: 04/28/1946

## 2017-05-27 ENCOUNTER — Ambulatory Visit: Payer: Medicare Other | Admitting: Physical Therapy

## 2017-05-27 DIAGNOSIS — R2689 Other abnormalities of gait and mobility: Secondary | ICD-10-CM

## 2017-05-27 DIAGNOSIS — M25561 Pain in right knee: Secondary | ICD-10-CM | POA: Diagnosis not present

## 2017-05-27 NOTE — Therapy (Addendum)
[facility] [place], [place], [address] Phone: [phone]   Fax:  [phone]  Physical Therapy Treatment PHYSICAL THERAPY DISCHARGE SUMMARY  Visits from Start of Care: 3  Current functional level related to goals / functional outcomes: See below   Remaining deficits: Goals not met   Education / Equipment: HEP Plan: Patient agrees to discharge.  Patient goals were not met. Patient is being discharged due to not returning since the last visit.  ?????    Gabriela Eves, PT, DPT 06/24/19  Patient Details  Name: Veronica Rojas MRN: [id number] Date of Birth: [birth year] Referring Provider: Melrose Nakayama MD   Encounter Date: 05/27/2017  PT End of Session - 05/27/17 1431    Visit Number  3    Number of Visits  18    Date for PT Re-Evaluation  07/01/17    PT Start Time  3276    PT Stop Time  1522    PT Time Calculation (min)  51 min    Activity Tolerance  Patient tolerated treatment well    Behavior During Therapy  [facility] for tasks assessed/performed       Past Medical History:  Diagnosis Date  . Anxiety   . Depression   . GERD (gastroesophageal reflux disease)   . Glaucoma    left eye got bacterial infection  . 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 Macular hole of both eyes     Past Surgical History:  Procedure Laterality Date  . ABDOMINAL HYSTERECTOMY    . CHOLECYSTECTOMY    . EYE SURGERY     pt reports 10 eye surgeries  . KNEE ARTHROSCOPY Right 03/04/2017   Procedure: ARTHROSCOPY KNEE;  Surgeon: Melrose Nakayama, MD;  Location: [place];  Service: Orthopedics;  Laterality: Right;  . PARS PLANA VITRECTOMY Left 04/12/2015   Procedure: PARS PLANA VITRECTOMY 25 GAUGE FOR ENDOPHTHALMITIS/VITREOUS TAP/INJECTION OF ANTIBOTICS, REVISION OF OPERATIVE WOUND, CONJUNCTIVA FLAP;  Surgeon: Hurman Horn, MD;  Location: [place];  Service: Ophthalmology;  Laterality: Left;  . TUBAL  LIGATION      There were no vitals filed for this visit.  Subjective Assessment - 05/27/17 1436    Subjective  Patient reported feeling more sore today than last week. Patient does not recall performing any activities that would increase pain. Patient stated pain is about 1-2/10 in right knee. Patient requested to attend physical therapy 1x per week due to high copay.    Currently in Pain?  Yes    Pain Score  2     Pain Location  Knee    Pain Orientation  Right    Pain Descriptors / Indicators  Sore    Pain Type  Chronic pain    Pain Onset  More than a month ago                      [facility] Adult PT Treatment/Exercise - 05/27/17 0001      Exercises   Exercises  Knee/Hip      Knee/Hip Exercises: Aerobic   Nustep  Seat 6, Level 3, x15      Knee/Hip Exercises: Standing   Heel Raises  Both;20 reps    Hip Abduction  Stengthening;Both;20 reps;Knee straight    Other Standing Knee Exercises  Semi-tandem stance 3x30" each, no UE support      Knee/Hip Exercises: Seated   Long Scientist, clinical (histocompatibility and immunogenetics)  Long Arc Quad Weight  2 lbs.    Long [employer] Limitations  full range, 2 second hold in ext    Other Seated Knee/Hip Exercises  HS curl red t-band 2x10      Knee/Hip Exercises: Supine   Hip Adduction Isometric  Strengthening;20 reps 5 second hold    Bridges  Strengthening;20 reps;Both      Modalities   Modalities  Iontophoresis      Iontophoresis   Type of Iontophoresis  Dexamethasone    Location  R medial Knee    Dose  1.0 mL (4 Hr) 3 of 6    Time  4 hour patch      Manual Therapy   Manual Therapy  Passive ROM    Manual therapy comments  Sidelying R quad stretch, 4x30 seconds.              PT Long Term Goals - 05/20/17 1514      PT LONG TERM GOAL #1   Title  I with HEP    Time  6    Period  Weeks    Status  New    Target Date  07/01/17      PT LONG TERM GOAL #2   Title  Patient to amb community distances with a normal gait pattern with pain  1/10 or less.    Time  6    Period  Weeks    Status  New      PT LONG TERM GOAL #3   Title  Patient to report decreased pain in the R knee with ADLS to 8/93 or less.    Time  6    Period  Weeks            Plan - 05/27/17 1530    Clinical Impression Statement  Patient was able to tolerate treatment well as seen by the ability to complete exercises without any increases of pain/discomfort. Patient was able to complete balance activities with minimal sway and close supervision. No loss of balance noted. Patient verbalized understanding with removal instructions with iontophoresis. Due to high copay, patient would like to decrease to 1 therapy session per week. Patient and PT developed plan for patient to focus on strength and stretching at home while exercises at therapy would focus on balance and gait. Patient in agreement.    Rehab Potential  Excellent    PT Frequency  2x / week    PT Duration  6 weeks    PT Treatment/Interventions  ADLs/Self Care Home Management;Cryotherapy;Electrical Stimulation;Iontophoresis 20m/ml Dexamethasone;Moist Heat;Ultrasound;Gait training;Therapeutic exercise;Balance training;Neuromuscular re-education;Patient/family education;Manual techniques;Taping;Dry needling    PT Next Visit Plan  Create HEP consisting of strength and stretching and review with patient. Focus on balance and gait activities for next visit.    Consulted and Agree with Plan of Care  Patient       Patient will benefit from skilled therapeutic intervention in order to improve the following deficits and impairments:  Abnormal gait, Pain, Decreased strength, Impaired flexibility  Visit Diagnosis: Acute pain of right knee  Other abnormalities of gait and mobility     Problem List There are no active problems to display for this patient.  KGabriela Eves PT, DPT 05/27/2017, 3:51 PM  [facility]  [address]Phone: [phone]  Fax:  [phone] Name: GGibraltarA Mavity MRN: [id number] of Birth: [birth year]

## 2018-07-16 ENCOUNTER — Other Ambulatory Visit: Payer: Self-pay | Admitting: Family Medicine

## 2018-07-16 DIAGNOSIS — E2839 Other primary ovarian failure: Secondary | ICD-10-CM

## 2018-07-16 DIAGNOSIS — Z1231 Encounter for screening mammogram for malignant neoplasm of breast: Secondary | ICD-10-CM

## 2018-09-18 ENCOUNTER — Ambulatory Visit: Payer: Medicare Other

## 2018-09-18 ENCOUNTER — Other Ambulatory Visit: Payer: Medicare Other

## 2018-11-30 ENCOUNTER — Other Ambulatory Visit: Payer: Self-pay

## 2018-11-30 ENCOUNTER — Ambulatory Visit
Admission: RE | Admit: 2018-11-30 | Discharge: 2018-11-30 | Disposition: A | Discharge status: Home / Self Care | Payer: Medicare Other | Source: Ambulatory Visit | Attending: Family Medicine | Admitting: Family Medicine

## 2018-11-30 DIAGNOSIS — E2839 Other primary ovarian failure: Secondary | ICD-10-CM

## 2018-11-30 DIAGNOSIS — Z1231 Encounter for screening mammogram for malignant neoplasm of breast: Secondary | ICD-10-CM

## 2019-08-03 ENCOUNTER — Ambulatory Visit: Payer: Medicare Other | Attending: Orthopedic Surgery | Admitting: Physical Therapy

## 2019-08-03 ENCOUNTER — Other Ambulatory Visit: Payer: Self-pay

## 2019-08-03 ENCOUNTER — Encounter: Payer: Self-pay | Admitting: Physical Therapy

## 2019-08-03 DIAGNOSIS — M25562 Pain in left knee: Secondary | ICD-10-CM | POA: Diagnosis present

## 2019-08-03 DIAGNOSIS — M6281 Muscle weakness (generalized): Secondary | ICD-10-CM | POA: Diagnosis present

## 2019-08-03 DIAGNOSIS — R262 Difficulty in walking, not elsewhere classified: Secondary | ICD-10-CM

## 2019-08-03 DIAGNOSIS — R600 Localized edema: Secondary | ICD-10-CM | POA: Diagnosis present

## 2019-08-03 NOTE — Therapy (Signed)
[facility] [place], [place], [address] Phone: [phone]   Fax:  [phone]  Physical Therapy Evaluation  Patient Details  Name: Veronica Rojas MRN: [id number] Date of Birth: Oct 15, 1945 Referring Provider (PT): Dr. Berenice Primas   Encounter Date: 08/03/2019  PT End of Session - 08/03/19 1151    Visit Number  1    Number of Visits  13    Authorization Type  UHC, MCR    PT Start Time  1115    PT Stop Time  1200    PT Time Calculation (min)  45 min    Activity Tolerance  Patient tolerated treatment well    Behavior During Therapy  [facility] for tasks assessed/performed       Past Medical History:  Diagnosis Date  . Anxiety   . Depression   . GERD (gastroesophageal reflux disease)   . Glaucoma    left eye got bacterial infection  . 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 Macular hole of both eyes     Past Surgical History:  Procedure Laterality Date  . ABDOMINAL HYSTERECTOMY    . CHOLECYSTECTOMY    . EYE SURGERY     pt reports 10 eye surgeries  . KNEE ARTHROSCOPY Right 03/04/2017   Procedure: ARTHROSCOPY KNEE;  Surgeon: Melrose Nakayama, MD;  Location: [place];  Service: Orthopedics;  Laterality: Right;  . PARS PLANA VITRECTOMY Left 04/12/2015   Procedure: PARS PLANA VITRECTOMY 25 GAUGE FOR ENDOPHTHALMITIS/VITREOUS TAP/INJECTION OF ANTIBOTICS, REVISION OF OPERATIVE WOUND, CONJUNCTIVA FLAP;  Surgeon: Hurman Horn, MD;  Location: [place];  Service: Ophthalmology;  Laterality: Left;  . TUBAL LIGATION      There were no vitals filed for this visit.   Subjective Assessment - 08/03/19 1121    Subjective  Pt arriving to [facility]pay today for evaluation of L knee s/p arhroscopy on 07/14/2019.    Pertinent History  s/p L knee arthroscopy 07/14/2019, R knee arthroscopy 2019    Patient Stated Goals  to use my leg better and not be sore    Currently in Pain?  Yes    Pain Score  0-No pain     Pain Location  Knee    Pain Orientation  Right    Pain Type  Surgical pain    Pain Onset  1 to 4 weeks ago    Pain Frequency  Intermittent    Aggravating Factors   bending, walking, standing longer periods    Pain Relieving Factors  resting, ice/elevation, pain meds as needed over the counter         [facility] PT Assessment - 08/03/19 0001      Assessment   Medical Diagnosis  L knee arthroscopy 07/14/2019    Referring Provider (PT)  Dr. Berenice Primas    Onset Date/Surgical Date  07/14/19    Hand Dominance  Right    Prior Therapy  yes, for R knee arthroscopy      Precautions   Precautions  None      Restrictions   Weight Bearing Restrictions  No      Balance Screen   Has the patient fallen in the past 6 months  Yes    How many times?  3    Is the patient reluctant to leave their home because of a fear of falling?   No      Home Film/video editor residence  Living Arrangements  Spouse/significant other    Type of Home  House    Home Access  Stairs to enter    Entrance Stairs-Number of Steps  6    Entrance Stairs-Rails  Can reach both    Home Layout  One level      Prior Function   Level of Independence  Independent    Vocation  Retired    Leisure  garden      Cognition   Overall Cognitive Status  Within Functional Limits for tasks assessed      Observation/Other Assessments   Focus on Therapeutic Outcomes (FOTO)   40% limited      Observation/Other Assessments-Edema    Edema  Circumferential      Circumferential Edema   Circumferential - Right  42 centimeters, mid patella    Circumferential - Left   46 centimeters, mid patella      Posture/Postural Control   Posture/Postural Control  Postural limitations      ROM / Strength   AROM / PROM / Strength  AROM;PROM;Strength      AROM   AROM Assessment Site  Knee    Right/Left Knee  Right;Left    Right Knee Extension  0    Right Knee Flexion  120    Left Knee Extension  5    Left Knee Flexion   98      PROM   PROM Assessment Site  Knee    Right/Left Knee  Left    Left Knee Extension  0    Left Knee Flexion  104      Strength   Strength Assessment Site  Knee    Right/Left Knee  Right;Left    Right Knee Flexion  5/5    Right Knee Extension  5/5    Left Knee Flexion  4+/5    Left Knee Extension  4+/5      Transfers   Five time sit to stand comments   18.5 seconds using UE support      Ambulation/Gait   Gait Pattern  Decreased hip/knee flexion - left                Objective measurements completed on examination: See above findings.              PT Education - 08/03/19 1151    Education Details  PT POC, HEP    Person(s) Educated  Patient    Methods  Explanation;Demonstration    Comprehension  Verbalized understanding;Returned demonstration          PT Long Term Goals - 08/03/19 1152      PT LONG TERM GOAL #1   Title  Pt will be independent in her HEP and progression.    Time  6    Period  Weeks    Status  New      PT LONG TERM GOAL #2   Title  Patient to amb community distances with a normal gait pattern with no pain.    Time  6    Period  Weeks    Status  New      PT LONG TERM GOAL #3   Title  Pt will improve her 5 x sit to stand with no UE support in </= 15 seconds.    Baseline  18.5 seconds with UE support    Time  6    Period  Weeks      PT LONG TERM GOAL #4   Title  Pt will improve her L knee flexion to >/= 120 degrees.    Baseline  98 degrees    Time  6    Period  Weeks    Status  New      PT LONG TERM GOAL #5   Title  Pt will improve her FOTO score from 40% limitation to </= 25% limitation.    Time  6    Period  Weeks    Status  New             Plan - 08/03/19 1210    Clinical Impression Statement  Pt arriving to therapy today after following left knee arthroscopy on 07/14/2019. Pt presenting with PROM: 0-104 degrees and AROM 5-98 degrees. Pt with MMT 4+/5. Pt amb with no device with mild antalgic gait  pattern. Pt would benefit from skilled PT to progress toward her PLOF using the below interventions.    Personal Factors and Comorbidities  Comorbidity 3+    Comorbidities  anxiety, depression, glaucoma, GERD, macular hole in both eyes, R knee arthroscopy 2019, L pars plana vitrectomy, hysterectomy    Examination-Activity Limitations  Squat;Stairs;Stand    Examination-Participation Restrictions  Community Activity;Yard Work    Stability/Clinical Decision Making  Stable/Uncomplicated    Optometrist  Low    Rehab Potential  Excellent    PT Frequency  2x / week    PT Duration  6 weeks    PT Treatment/Interventions  ADLs/Self Care Home Management;Cryotherapy;Electrical Stimulation;Moist Heat;Ultrasound;Gait training;Stair training;Functional mobility training;Therapeutic exercise;Therapeutic activities;Patient/family education;Manual techniques;Passive range of motion;Taping;Vasopneumatic Device    PT Next Visit Plan  bike, LE strengthening, SLS activities, L knee ROM exercises, Vasopneumatic    PT Home Exercise Plan  see pt instructions    Consulted and Agree with Plan of Care  Patient       Patient will benefit from skilled therapeutic intervention in order to improve the following deficits and impairments:  Postural dysfunction, Decreased strength, Decreased activity tolerance, Impaired flexibility, Decreased range of motion, Increased edema, Difficulty walking  Visit Diagnosis: Acute pain of left knee  Difficulty in walking, not elsewhere classified  Muscle weakness (generalized)  Localized edema     Problem List There are no problems to display for this patient.   Sharmon Leyden, MPT 08/03/2019, 12:15 PM  [facility] [address] [place], [place], [address] Phone: [phone]   Fax:  [phone]  Name: Veronica Rojas MRN: [id number] Date of Birth: [birth year]

## 2019-08-03 NOTE — Patient Instructions (Signed)
Access Code: [email or web address] URL: [email or web address] Date: 08/03/2019 Prepared by: Narda Amber  Exercises Heel rises with counter support - 2-3 x daily - 7 x weekly - 2 sets - 10 reps March in Place - 2-3 x daily - 7 x weekly - 2 sets - 10 reps Standing Hip Abduction with Counter Support - 2-3 x daily - 7 x weekly - 2 sets - 10 reps Supine Straight Leg Raises - 2-3 x daily - 7 x weekly - 2 sets - 10 reps Long Sitting Quad Set with Towel Roll Under Heel - 2-3 x daily - 7 x weekly - 2 sets - 10 reps

## 2019-08-05 ENCOUNTER — Other Ambulatory Visit: Payer: Self-pay

## 2019-08-05 ENCOUNTER — Ambulatory Visit: Payer: Medicare Other | Attending: Orthopedic Surgery | Admitting: *Deleted

## 2019-08-05 DIAGNOSIS — M6281 Muscle weakness (generalized): Secondary | ICD-10-CM | POA: Diagnosis present

## 2019-08-05 DIAGNOSIS — R262 Difficulty in walking, not elsewhere classified: Secondary | ICD-10-CM | POA: Diagnosis present

## 2019-08-05 DIAGNOSIS — M25562 Pain in left knee: Secondary | ICD-10-CM | POA: Diagnosis present

## 2019-08-05 DIAGNOSIS — R600 Localized edema: Secondary | ICD-10-CM

## 2019-08-05 NOTE — Therapy (Signed)
[facility] [place], [place], [address] Phone: [phone]   Fax:  [phone]  Physical Therapy Treatment  Patient Details  Name: Veronica Rojas MRN: [id number] Date of Birth: 25-Feb-1946 Referring Provider (PT): Dr. Lowella Petties   Encounter Date: 08/05/2019  PT End of Session - 08/05/19 1156    Visit Number  2    Number of Visits  13    Authorization Type  UHC, MCR    PT Start Time  1115    PT Stop Time  1209    PT Time Calculation (min)  54 min       Past Medical History:  Diagnosis Date  . Anxiety   . Depression   . GERD (gastroesophageal reflux disease)   . Glaucoma    left eye got bacterial infection  . 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 Macular hole of both eyes     Past Surgical History:  Procedure Laterality Date  . ABDOMINAL HYSTERECTOMY    . CHOLECYSTECTOMY    . EYE SURGERY     pt reports 10 eye surgeries  . KNEE ARTHROSCOPY Right 03/04/2017   Procedure: ARTHROSCOPY KNEE;  Surgeon: Melrose Nakayama, MD;  Location: [place];  Service: Orthopedics;  Laterality: Right;  . PARS PLANA VITRECTOMY Left 04/12/2015   Procedure: PARS PLANA VITRECTOMY 25 GAUGE FOR ENDOPHTHALMITIS/VITREOUS TAP/INJECTION OF ANTIBOTICS, REVISION OF OPERATIVE WOUND, CONJUNCTIVA FLAP;  Surgeon: Hurman Horn, MD;  Location: [place];  Service: Ophthalmology;  Laterality: Left;  . TUBAL LIGATION      There were no vitals filed for this visit.  Subjective Assessment - 08/05/19 1118    Subjective  Pt arriving to [facility] today for evaluation of L knee s/p arhroscopy on 07/14/2019.    Pertinent History  s/p L knee arthroscopy 07/14/2019, R knee arthroscopy 2019    Patient Stated Goals  to use my leg better and not be sore    Currently in Pain?  Yes    Pain Score  1     Pain Location  Knee    Pain Orientation  Right    Pain Descriptors / Indicators  Sore;Tightness    Pain Type  Surgical  pain    Pain Onset  1 to 4 weeks ago                       [facility] Adult PT Treatment/Exercise - 08/05/19 0001      Exercises   Exercises  Knee/Hip      Knee/Hip Exercises: Aerobic   Stationary Bike  Tried , but painful    Nustep  Nustep x 15 mins seat 6 and then 5 for ROM progression      Knee/Hip Exercises: Standing   Rocker Board  3 minutes   PF/ DF and calf surgery     Knee/Hip Exercises: Seated   Long Arc Quad  Strengthening;3 sets;10 reps    Long Arc Quad Weight  2 lbs.      Modalities   Modalities  Artist  LT knee    Electrical Stimulation Action  IFC    Electrical Stimulation Parameters  1-10hz  x 15 mins    Electrical Stimulation Goals  Pain;Edema      Vasopneumatic   Number Minutes Vasopneumatic   15 minutes    Vasopnuematic Location  Knee    Vasopneumatic Pressure  Low    Vasopneumatic Temperature   36      Manual Therapy   Manual Therapy  Passive ROM    Passive ROM  PROM for flexion and extension                  PT Long Term Goals - 08/03/19 1152      PT LONG TERM GOAL #1   Title  Pt will be independent in her HEP and progression.    Time  6    Period  Weeks    Status  New      PT LONG TERM GOAL #2   Title  Patient to amb community distances with a normal gait pattern with no pain.    Time  6    Period  Weeks    Status  New      PT LONG TERM GOAL #3   Title  Pt will improve her 5 x sit to stand with no UE support in </= 15 seconds.    Baseline  18.5 seconds with UE support    Time  6    Period  Weeks      PT LONG TERM GOAL #4   Title  Pt will improve her L knee flexion to >/= 120 degrees.    Baseline  98 degrees    Time  6    Period  Weeks    Status  New      PT LONG TERM GOAL #5   Title  Pt will improve her FOTO score from 40% limitation to </= 25% limitation.    Time  6    Period  Weeks    Status  New             Plan - 08/05/19 1205    Clinical Impression Statement  Pt arrived today doing fairly well with LT knee. She was guided through ROM and light strengthening therex f/b PROM for flexion and extension. Normal modality response today. Pt felt great end of session.    Personal Factors and Comorbidities  Comorbidity 3+    Comorbidities  anxiety, depression, glaucoma, GERD, macular hole in both eyes, R knee arthroscopy 2019, L pars plana vitrectomy, hysterectomy    Examination-Participation Restrictions  Community Activity;Yard Work    Stability/Clinical Decision Making  Stable/Uncomplicated    Rehab Potential  Excellent    PT Frequency  2x / week    PT Duration  6 weeks    PT Treatment/Interventions  ADLs/Self Care Home Management;Cryotherapy;Electrical Stimulation;Moist Heat;Ultrasound;Gait training;Stair training;Functional mobility training;Therapeutic exercise;Therapeutic activities;Patient/family education;Manual techniques;Passive range of motion;Taping;Vasopneumatic Device    PT Next Visit Plan  bike, LE strengthening, SLS activities, L knee ROM exercises, Vasopneumatic    PT Home Exercise Plan  see pt instructions    Consulted and Agree with Plan of Care  Patient       Patient will benefit from skilled therapeutic intervention in order to improve the following deficits and impairments:  Postural dysfunction, Decreased strength, Decreased activity tolerance, Impaired flexibility, Decreased range of motion, Increased edema, Difficulty walking  Visit Diagnosis: Acute pain of left knee  Difficulty in walking, not elsewhere classified  Muscle weakness (generalized)  Localized edema     Problem List There are no problems to display for this patient.   Deeya Richeson,CHRIS, PTA 08/05/2019, 12:09 PM  [facility] [address] [place], [place], [address] Phone: [phone]   Fax:  [phone]  Name: Veronica Rojas MRN:  [id number] Date of Birth: 1945-06-13

## 2019-08-10 ENCOUNTER — Ambulatory Visit: Payer: Medicare Other | Admitting: *Deleted

## 2019-08-10 ENCOUNTER — Other Ambulatory Visit: Payer: Self-pay

## 2019-08-10 DIAGNOSIS — M25562 Pain in left knee: Secondary | ICD-10-CM

## 2019-08-10 DIAGNOSIS — R262 Difficulty in walking, not elsewhere classified: Secondary | ICD-10-CM

## 2019-08-10 DIAGNOSIS — M6281 Muscle weakness (generalized): Secondary | ICD-10-CM

## 2019-08-10 DIAGNOSIS — R600 Localized edema: Secondary | ICD-10-CM

## 2019-08-10 NOTE — Therapy (Signed)
[facility] [address] [place], [place], [address] Phone: [phone]   Fax:  [phone]  Physical Therapy Treatment  Patient Details  Name: Veronica Rojas MRN: [id number] Date of Birth: Jul 08, 1945 Referring Provider (PT): Dr. Milly Jakob   Encounter Date: 08/10/2019  PT End of Session - 08/10/19 1129    Visit Number  3    Number of Visits  13    Authorization Type  UHC, MCR    PT Start Time  1122    PT Stop Time  1213    PT Time Calculation (min)  51 min       Past Medical History:  Diagnosis Date  . Anxiety   . Depression   . GERD (gastroesophageal reflux disease)   . Glaucoma    left eye got bacterial infection  . 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 Macular hole of both eyes     Past Surgical History:  Procedure Laterality Date  . ABDOMINAL HYSTERECTOMY    . CHOLECYSTECTOMY    . EYE SURGERY     pt reports 10 eye surgeries  . KNEE ARTHROSCOPY Right 03/04/2017   Procedure: ARTHROSCOPY KNEE;  Surgeon: Marcene Corning, MD;  Location: [facility] OR;  Service: Orthopedics;  Laterality: Right;  . PARS PLANA VITRECTOMY Left 04/12/2015   Procedure: PARS PLANA VITRECTOMY 25 GAUGE FOR ENDOPHTHALMITIS/VITREOUS TAP/INJECTION OF ANTIBOTICS, REVISION OF OPERATIVE WOUND, CONJUNCTIVA FLAP;  Surgeon: Edmon Crape, MD;  Location: [facility] OR;  Service: Ophthalmology;  Laterality: Left;  . TUBAL LIGATION      There were no vitals filed for this visit.  Subjective Assessment - 08/10/19 1127    Subjective  [facility] today for evaluation of L knee s/p arhroscopy on 07/14/2019. L knee is sore/ stiff today    Pertinent History  s/p L knee arthroscopy 07/14/2019, R knee arthroscopy 2019    Patient Stated Goals  to use my leg better and not be sore    Currently in Pain?  Yes    Pain Score  2     Pain Location  Knee    Pain Orientation  Right    Pain Descriptors / Indicators   Sore;Tightness    Pain Type  Surgical pain    Pain Onset  1 to 4 weeks ago                       [facility] Adult PT Treatment/Exercise - 08/10/19 0001      Exercises   Exercises  Knee/Hip      Knee/Hip Exercises: Aerobic   Nustep  Nustep x 15 mins seat 6 and then 5 for ROM progression      Knee/Hip Exercises: Standing   Rocker Board  3 minutes   PF/ DF and calf surgery     Knee/Hip Exercises: Seated   Long Arc Quad  Strengthening;3 sets;10 reps    Long Arc Quad Weight  2 lbs.      Modalities   Modalities  Programmer, applications Location  LT knee    Electrical Stimulation Action  IFC    Electrical Stimulation Parameters  1-10 hz x 15 mins    Electrical Stimulation Goals  Pain;Edema      Vasopneumatic   Number Minutes Vasopneumatic   15 minutes    Vasopnuematic Location   Knee  Vasopneumatic Pressure  Low    Vasopneumatic Temperature   36      Manual Therapy   Manual Therapy  Passive ROM    Passive ROM  PROM for flexion and extension                  PT Long Term Goals - 08/03/19 1152      PT LONG TERM GOAL #1   Title  Pt will be independent in her HEP and progression.    Time  6    Period  Weeks    Status  New      PT LONG TERM GOAL #2   Title  Patient to amb community distances with a normal gait pattern with no pain.    Time  6    Period  Weeks    Status  New      PT LONG TERM GOAL #3   Title  Pt will improve her 5 x sit to stand with no UE support in </= 15 seconds.    Baseline  18.5 seconds with UE support    Time  6    Period  Weeks      PT LONG TERM GOAL #4   Title  Pt will improve her L knee flexion to >/= 120 degrees.    Baseline  98 degrees    Time  6    Period  Weeks    Status  New      PT LONG TERM GOAL #5   Title  Pt will improve her FOTO score from 40% limitation to </= 25% limitation.    Time  6    Period  Weeks    Status  New             Plan - 08/10/19 1133    Clinical Impression Statement  Pt arrived reporting mainly just soreness LT knee. She was able to perform the nustep and the bike today for ROM and then light strengthening and did well. AROM for flexion today was 115 degrees. Normal modality response.    Comorbidities  anxiety, depression, glaucoma, GERD, macular hole in both eyes, R knee arthroscopy 2019, L pars plana vitrectomy, hysterectomy    Examination-Activity Limitations  Squat;Stairs;Stand    Examination-Participation Restrictions  Community Activity;Yard Work    Stability/Clinical Decision Making  Stable/Uncomplicated    Rehab Potential  Excellent    PT Frequency  2x / week    PT Duration  6 weeks    PT Treatment/Interventions  ADLs/Self Care Home Management;Cryotherapy;Electrical Stimulation;Moist Heat;Ultrasound;Gait training;Stair training;Functional mobility training;Therapeutic exercise;Therapeutic activities;Patient/family education;Manual techniques;Passive range of motion;Taping;Vasopneumatic Device    PT Next Visit Plan  bike, LE strengthening, SLS activities, L knee ROM exercises, Vasopneumatic    Consulted and Agree with Plan of Care  Patient       Patient will benefit from skilled therapeutic intervention in order to improve the following deficits and impairments:  Postural dysfunction, Decreased strength, Decreased activity tolerance, Impaired flexibility, Decreased range of motion, Increased edema, Difficulty walking  Visit Diagnosis: Acute pain of left knee  Difficulty in walking, not elsewhere classified  Muscle weakness (generalized)  Localized edema     Problem List There are no problems to display for this patient.   Loucile Posner,CHRIS, PTA 08/10/2019, 12:13 PM  [facility] [place], [place], [address] Phone: [phone]   Fax:  [phone]  Name: Veronica Rojas MRN: [id number] Date of Birth:  April 14, 1946

## 2019-08-12 ENCOUNTER — Ambulatory Visit: Payer: Medicare Other | Admitting: Physical Therapy

## 2019-08-12 ENCOUNTER — Encounter: Payer: Self-pay | Admitting: Physical Therapy

## 2019-08-12 ENCOUNTER — Other Ambulatory Visit: Payer: Self-pay

## 2019-08-12 DIAGNOSIS — R600 Localized edema: Secondary | ICD-10-CM

## 2019-08-12 DIAGNOSIS — R262 Difficulty in walking, not elsewhere classified: Secondary | ICD-10-CM

## 2019-08-12 DIAGNOSIS — M25562 Pain in left knee: Secondary | ICD-10-CM

## 2019-08-12 DIAGNOSIS — M6281 Muscle weakness (generalized): Secondary | ICD-10-CM

## 2019-08-12 NOTE — Therapy (Signed)
[facility] [address] [place], [place], [address] Phone: [phone]   Fax:  [phone]  Physical Therapy Treatment  Patient Details  Name: Veronica Rojas MRN: [id number] Date of Birth: [birth year] Referring Provider (PT): Dr. Milly Jakob   Encounter Date: 08/12/2019  PT End of Session - 08/12/19 1121    Visit Number  4    Number of Visits  13    Authorization Type  UHC, MCR    PT Start Time  1118    PT Stop Time  1158    PT Time Calculation (min)  40 min    Activity Tolerance  Patient tolerated treatment well    Behavior During Therapy  [place] for tasks assessed/performed       Past Medical History:  Diagnosis Date  . Anxiety   . Depression   . GERD (gastroesophageal reflux disease)   . Glaucoma    left eye got bacterial infection  . 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 Macular hole of both eyes     Past Surgical History:  Procedure Laterality Date  . ABDOMINAL HYSTERECTOMY    . CHOLECYSTECTOMY    . EYE SURGERY     pt reports 10 eye surgeries  . KNEE ARTHROSCOPY Right 03/04/2017   Procedure: ARTHROSCOPY KNEE;  Surgeon: Marcene Corning, MD;  Location: [facility] OR;  Service: Orthopedics;  Laterality: Right;  . PARS PLANA VITRECTOMY Left 04/12/2015   Procedure: PARS PLANA VITRECTOMY 25 GAUGE FOR ENDOPHTHALMITIS/VITREOUS TAP/INJECTION OF ANTIBOTICS, REVISION OF OPERATIVE WOUND, CONJUNCTIVA FLAP;  Surgeon: Edmon Crape, MD;  Location: [facility] OR;  Service: Ophthalmology;  Laterality: Left;  . TUBAL LIGATION      There were no vitals filed for this visit.  Subjective Assessment - 08/12/19 1120    Subjective  COVID 19 screening performed on patient upon arrival. Patient reports that her knee is much better.    Pertinent History  s/p L knee arthroscopy 07/14/2019, R knee arthroscopy 2019    Patient Stated Goals  to use my leg better and not be sore    Currently in Pain?  No/denies          [facility] PT Assessment - 08/12/19 0001      Assessment   Medical Diagnosis  L knee arthroscopy 07/14/2019    Referring Provider (PT)  Dr. Milly Jakob    Onset Date/Surgical Date  07/14/19    Hand Dominance  Right    Next MD Visit  09/2019    Prior Therapy  yes, for R knee arthroscopy      Precautions   Precautions  None      Restrictions   Weight Bearing Restrictions  No                   [facility] Adult PT Treatment/Exercise - 08/12/19 0001      Knee/Hip Exercises: Aerobic   Nustep  L5 x10 min      Knee/Hip Exercises: Standing   Terminal Knee Extension  Strengthening;Left;2 sets;10 reps;Theraband    Theraband Level (Terminal Knee Extension)  Level 2 (Red)    Hip Abduction  AROM;Both;2 sets;10 reps;Knee straight    Forward Step Up  Left;15 reps;Hand Hold: 2;Step Height: 6"    Rocker Board  3 minutes      Knee/Hip Exercises: Supine   Straight Leg Raises  AROM;Left;20 reps      Modalities   Modalities  Vasopneumatic  Vasopneumatic   Number Minutes Vasopneumatic   15 minutes    Vasopnuematic Location   Knee    Vasopneumatic Pressure  Medium    Vasopneumatic Temperature   34                  PT Long Term Goals - 08/03/19 1152      PT LONG TERM GOAL #1   Title  Pt will be independent in her HEP and progression.    Time  6    Period  Weeks    Status  New      PT LONG TERM GOAL #2   Title  Patient to amb community distances with a normal gait pattern with no pain.    Time  6    Period  Weeks    Status  New      PT LONG TERM GOAL #3   Title  Pt will improve her 5 x sit to stand with no UE support in </= 15 seconds.    Baseline  18.5 seconds with UE support    Time  6    Period  Weeks      PT LONG TERM GOAL #4   Title  Pt will improve her L knee flexion to >/= 120 degrees.    Baseline  98 degrees    Time  6    Period  Weeks    Status  New      PT LONG TERM GOAL #5   Title  Pt will improve her FOTO score from 40% limitation  to </= 25% limitation.    Time  6    Period  Weeks    Status  New            Plan - 08/12/19 1156    Clinical Impression Statement  Patient presented in clinic with no L knee pain today. Patient guided through low level LLE strengthening with no complaints of any increased pain. Patient noted that rockerboard stretching much easier today. Good L quad activiation noted during therex as well. Normal vasopnuematic response noted following removal of the modality.    Personal Factors and Comorbidities  Comorbidity 3+    Comorbidities  anxiety, depression, glaucoma, GERD, macular hole in both eyes, R knee arthroscopy 2019, L pars plana vitrectomy, hysterectomy    Examination-Activity Limitations  Squat;Stairs;Stand    Examination-Participation Restrictions  Community Activity;Yard Work    Stability/Clinical Decision Making  Stable/Uncomplicated    Rehab Potential  Excellent    PT Frequency  2x / week    PT Duration  6 weeks    PT Treatment/Interventions  ADLs/Self Care Home Management;Cryotherapy;Electrical Stimulation;Moist Heat;Ultrasound;Gait training;Stair training;Functional mobility training;Therapeutic exercise;Therapeutic activities;Patient/family education;Manual techniques;Passive range of motion;Taping;Vasopneumatic Device    PT Next Visit Plan  bike, LE strengthening, SLS activities, L knee ROM exercises, Vasopneumatic    PT Home Exercise Plan  see pt instructions    Consulted and Agree with Plan of Care  Patient       Patient will benefit from skilled therapeutic intervention in order to improve the following deficits and impairments:  Postural dysfunction, Decreased strength, Decreased activity tolerance, Impaired flexibility, Decreased range of motion, Increased edema, Difficulty walking  Visit Diagnosis: Acute pain of left knee  Difficulty in walking, not elsewhere classified  Muscle weakness (generalized)  Localized edema     Problem List There are no problems  to display for this patient.   Marvell Fuller, PTA 08/12/2019, 12:09 PM  [facility] [place], [place], [address] Phone: [phone]   Fax:  [phone]  Name: Veronica Rojas MRN: [id number] Date of Birth: [birth year]

## 2019-08-17 ENCOUNTER — Other Ambulatory Visit: Payer: Self-pay

## 2019-08-17 ENCOUNTER — Ambulatory Visit: Payer: Medicare Other | Admitting: *Deleted

## 2019-08-17 DIAGNOSIS — M25562 Pain in left knee: Secondary | ICD-10-CM | POA: Diagnosis not present

## 2019-08-17 DIAGNOSIS — R600 Localized edema: Secondary | ICD-10-CM

## 2019-08-17 DIAGNOSIS — M6281 Muscle weakness (generalized): Secondary | ICD-10-CM

## 2019-08-17 DIAGNOSIS — R262 Difficulty in walking, not elsewhere classified: Secondary | ICD-10-CM

## 2019-08-17 NOTE — Therapy (Signed)
[facility] [place], [place], [address] Phone: [phone]   Fax:  [phone]  Physical Therapy Treatment  Patient Details  Name: Veronica Rojas MRN: [id number] Date of Birth: 1945-07-04 Referring Provider (PT): Dr. Lowella Petties   Encounter Date: 08/17/2019  PT End of Session - 08/17/19 1148    Visit Number  5    Number of Visits  12    Authorization Type  UHC, MCR    PT Start Time  1115    PT Stop Time  1205    PT Time Calculation (min)  50 min       Past Medical History:  Diagnosis Date  . Anxiety   . Depression   . GERD (gastroesophageal reflux disease)   . Glaucoma    left eye got bacterial infection  . 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 Macular hole of both eyes     Past Surgical History:  Procedure Laterality Date  . ABDOMINAL HYSTERECTOMY    . CHOLECYSTECTOMY    . EYE SURGERY     pt reports 10 eye surgeries  . KNEE ARTHROSCOPY Right 03/04/2017   Procedure: ARTHROSCOPY KNEE;  Surgeon: Melrose Nakayama, MD;  Location: [place];  Service: Orthopedics;  Laterality: Right;  . PARS PLANA VITRECTOMY Left 04/12/2015   Procedure: PARS PLANA VITRECTOMY 25 GAUGE FOR ENDOPHTHALMITIS/VITREOUS TAP/INJECTION OF ANTIBOTICS, REVISION OF OPERATIVE WOUND, CONJUNCTIVA FLAP;  Surgeon: Hurman Horn, MD;  Location: [facility];  Service: Ophthalmology;  Laterality: Left;  . TUBAL LIGATION      There were no vitals filed for this visit.  Subjective Assessment - 08/17/19 1145    Subjective  COVID 19 screening performed on patient upon arrival. Patient reports that her knee is much better and MD f/u is 2-29-21    Pertinent History  s/p L knee arthroscopy 07/14/2019, R knee arthroscopy 2019    Patient Stated Goals  to use my leg better and not be sore    Currently in Pain?  No/denies    Pain Location  Knee    Pain Orientation  Right    Pain Descriptors / Indicators  Sore    Pain  Type  Surgical pain    Pain Onset  1 to 4 weeks ago                       [facility] Adult PT Treatment/Exercise - 08/17/19 0001      Knee/Hip Exercises: Aerobic   Stationary Bike  Bike seat 3 L1-2 x 8 mins    Nustep  L5 x10 min      Knee/Hip Exercises: Standing   Forward Step Up  Left;10 reps;Hand Hold: 2;3 Systems analyst  Other (comment)   PF/ DF and calf stretching for 3 mins and 3 mins for balance     Knee/Hip Exercises: Seated   Long Arc Quad  Strengthening;10 reps;4 sets    [employer] Weight  3 lbs.      Modalities   Modalities  Vasopneumatic      Vasopneumatic   Number Minutes Vasopneumatic   15 minutes    Vasopnuematic Location   Knee    Vasopneumatic Pressure  Medium    Vasopneumatic Temperature   34                  PT Long Term Goals - 08/03/19 1152  PT LONG TERM GOAL #1   Title  Pt will be independent in her HEP and progression.    Time  6    Period  Weeks    Status  New      PT LONG TERM GOAL #2   Title  Patient to amb community distances with a normal gait pattern with no pain.    Time  6    Period  Weeks    Status  New      PT LONG TERM GOAL #3   Title  Pt will improve her 5 x sit to stand with no UE support in </= 15 seconds.    Baseline  18.5 seconds with UE support    Time  6    Period  Weeks      PT LONG TERM GOAL #4   Title  Pt will improve her L knee flexion to >/= 120 degrees.    Baseline  98 degrees    Time  6    Period  Weeks    Status  New      PT LONG TERM GOAL #5   Title  Pt will improve her FOTO score from 40% limitation to </= 25% limitation.    Time  6    Period  Weeks    Status  New            Plan - 08/17/19 1205    Clinical Impression Statement  Pt arrived today doing fairly well with mainly soreness in LT knee. She was able to perform all therex today without pain increase and mainly fatigue. Pt did well with balance on rockerboard. Try SLS next Rx. Normal modality response.     Comorbidities  anxiety, depression, glaucoma, GERD, macular hole in both eyes, R knee arthroscopy 2019, L pars plana vitrectomy, hysterectomy    Examination-Activity Limitations  Squat;Stairs;Stand    Examination-Participation Restrictions  Community Activity;Yard Work    Stability/Clinical Decision Making  Stable/Uncomplicated    Rehab Potential  Excellent    PT Frequency  2x / week    PT Duration  6 weeks    PT Treatment/Interventions  ADLs/Self Care Home Management;Cryotherapy;Electrical Stimulation;Moist Heat;Ultrasound;Gait training;Stair training;Functional mobility training;Therapeutic exercise;Therapeutic activities;Patient/family education;Manual techniques;Passive range of motion;Taping;Vasopneumatic Device    PT Next Visit Plan  bike, LE strengthening, SLS activities, L knee ROM exercises, Vasopneumatic    Consulted and Agree with Plan of Care  Patient       Patient will benefit from skilled therapeutic intervention in order to improve the following deficits and impairments:  Postural dysfunction, Decreased strength, Decreased activity tolerance, Impaired flexibility, Decreased range of motion, Increased edema, Difficulty walking  Visit Diagnosis: Acute pain of left knee  Difficulty in walking, not elsewhere classified  Muscle weakness (generalized)  Localized edema     Problem List There are no problems to display for this patient.   Brecken Dewoody,CHRIS , PTA  08/17/2019, 12:16 PM  [facility] [address] [place], [place], [address] Phone: [phone]   Fax:  [phone]  Name: Veronica Rojas MRN: [id number] Date of Birth: 05/31/1945

## 2019-08-19 ENCOUNTER — Other Ambulatory Visit: Payer: Self-pay

## 2019-08-19 ENCOUNTER — Ambulatory Visit: Payer: Medicare Other | Admitting: *Deleted

## 2019-08-19 DIAGNOSIS — R262 Difficulty in walking, not elsewhere classified: Secondary | ICD-10-CM

## 2019-08-19 DIAGNOSIS — M25562 Pain in left knee: Secondary | ICD-10-CM

## 2019-08-19 DIAGNOSIS — M6281 Muscle weakness (generalized): Secondary | ICD-10-CM

## 2019-08-19 NOTE — Therapy (Signed)
[facility] [place], [place], [address] Phone: [phone]   Fax:  [phone]  Physical Therapy Treatment  Patient Details  Name: Veronica Rojas MRN: [id number] Date of Birth: [birth year] Referring Provider (PT): Dr. Lowella Petties   Encounter Date: 08/19/2019  PT End of Session - 08/19/19 1141    Visit Number  6    Number of Visits  12    Authorization Type  UHC, MCR    PT Start Time  1115    PT Stop Time  1203    PT Time Calculation (min)  48 min       Past Medical History:  Diagnosis Date  . Anxiety   . Depression   . GERD (gastroesophageal reflux disease)   . Glaucoma    left eye got bacterial infection  . 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 Macular hole of both eyes     Past Surgical History:  Procedure Laterality Date  . ABDOMINAL HYSTERECTOMY    . CHOLECYSTECTOMY    . EYE SURGERY     pt reports 10 eye surgeries  . KNEE ARTHROSCOPY Right 03/04/2017   Procedure: ARTHROSCOPY KNEE;  Surgeon: Melrose Nakayama, MD;  Location: [place];  Service: Orthopedics;  Laterality: Right;  . PARS PLANA VITRECTOMY Left 04/12/2015   Procedure: PARS PLANA VITRECTOMY 25 GAUGE FOR ENDOPHTHALMITIS/VITREOUS TAP/INJECTION OF ANTIBOTICS, REVISION OF OPERATIVE WOUND, CONJUNCTIVA FLAP;  Surgeon: Hurman Horn, MD;  Location: [place];  Service: Ophthalmology;  Laterality: Left;  . TUBAL LIGATION      There were no vitals filed for this visit.  Subjective Assessment - 08/19/19 1133    Subjective  COVID 19 screening performed on patient upon arrival.  Did great after last Rx  MD f/u is 2-29-21    Pertinent History  s/p L knee arthroscopy 07/14/2019, R knee arthroscopy 2019    Patient Stated Goals  to use my leg better and not be sore    Currently in Pain?  No/denies    Pain Location  Knee    Pain Orientation  Right    Pain Descriptors / Indicators  Sore    Pain Type  Surgical pain     Pain Onset  1 to 4 weeks ago    Pain Frequency  Intermittent                       [facility] Adult PT Treatment/Exercise - 08/19/19 0001      Knee/Hip Exercises: Aerobic   Stationary Bike  Bike seat 3 L1-2 x 10 mins    Nustep  L5 x10 min      Knee/Hip Exercises: Standing   Forward Step Up  Left;10 reps;Hand Hold: 2;3 Systems analyst  Other (comment)   PF/ DF and calf stretching for 3 mins and 3 mins for balance     Knee/Hip Exercises: Seated   Long Arc Quad  --    Long Arc Quad [employer]  --    Sit to [employer]  10 reps;without UE support      Modalities   Modalities  Vasopneumatic      Vasopneumatic   Number Minutes Vasopneumatic   15 minutes    Vasopnuematic Location   Knee    Vasopneumatic Pressure  Medium    Vasopneumatic Temperature   34  PT Long Term Goals - 08/19/19 1150      PT LONG TERM GOAL #1   Title  Pt will be independent in her HEP and progression.    Time  6    Period  Weeks    Status  On-going      PT LONG TERM GOAL #2   Title  Patient to amb community distances with a normal gait pattern with no pain.    Period  Weeks    Status  Achieved      PT LONG TERM GOAL #3   Title  Pt will improve her 5 x sit to stand with no UE support in </= 15 seconds.    Baseline  18.5 seconds with UE support    Time  6    Period  Weeks    Status  Achieved      PT LONG TERM GOAL #4   Title  Pt will improve her L knee flexion to >/= 120 degrees.    Baseline  98 degrees    Time  6    Period  Weeks    Status  Not Met   115 degrees     PT LONG TERM GOAL #5   Title  Pt will improve her FOTO score from 40% limitation to </= 25% limitation.    Time  6    Period  Weeks    Status  On-going            Plan - 08/19/19 1203    Clinical Impression Statement  Pt arrived today doing fairly well with minimal pain in LT knee. She was able to perform ROM and strengthening exs with mainly fatigue. No complaints performing the bike  today and was able to meet ambulation LTG as well as sit to stand. ROM LTG NM due to flexion was 115 degrees. Normal modality response. FOTO next visit    Comorbidities  anxiety, depression, glaucoma, GERD, macular hole in both eyes, R knee arthroscopy 2019, L pars plana vitrectomy, hysterectomy    Examination-Activity Limitations  Squat;Stairs;Stand    Examination-Participation Restrictions  Community Activity;Yard Work    Stability/Clinical Decision Making  Stable/Uncomplicated    Rehab Potential  Excellent    PT Frequency  2x / week    PT Duration  6 weeks    PT Treatment/Interventions  ADLs/Self Care Home Management;Cryotherapy;Electrical Stimulation;Moist Heat;Ultrasound;Gait training;Stair training;Functional mobility training;Therapeutic exercise;Therapeutic activities;Patient/family education;Manual techniques;Passive range of motion;Taping;Vasopneumatic Device    PT Next Visit Plan  bike, LE strengthening, SLS activities, L knee ROM exercises, Vasopneumatic    FOTO next visit       Patient will benefit from skilled therapeutic intervention in order to improve the following deficits and impairments:  Postural dysfunction, Decreased strength, Decreased activity tolerance, Impaired flexibility, Decreased range of motion, Increased edema, Difficulty walking  Visit Diagnosis: Acute pain of left knee  Difficulty in walking, not elsewhere classified  Muscle weakness (generalized)     Problem List There are no problems to display for this patient.   Jackalyn Haith,CHRIS, PTA 08/19/2019, 12:09 PM  [facility] [place], [place], [address] Phone: [phone]   Fax:  [phone]  Name: Veronica Rojas MRN: [id number] Date of Birth: November 04, 1945

## 2019-08-24 ENCOUNTER — Other Ambulatory Visit: Payer: Self-pay

## 2019-08-24 ENCOUNTER — Encounter: Payer: Self-pay | Admitting: Physical Therapy

## 2019-08-24 ENCOUNTER — Ambulatory Visit: Payer: Medicare Other | Admitting: Physical Therapy

## 2019-08-24 DIAGNOSIS — M25562 Pain in left knee: Secondary | ICD-10-CM | POA: Diagnosis not present

## 2019-08-24 DIAGNOSIS — R600 Localized edema: Secondary | ICD-10-CM

## 2019-08-24 DIAGNOSIS — M6281 Muscle weakness (generalized): Secondary | ICD-10-CM

## 2019-08-24 DIAGNOSIS — R262 Difficulty in walking, not elsewhere classified: Secondary | ICD-10-CM

## 2019-08-24 NOTE — Therapy (Signed)
[facility] [place], [place], [address] Phone: [phone]   Fax:  [phone]  Physical Therapy Treatment  Patient Details  Name: Veronica Rojas MRN: [id number] Date of Birth: 09-01-1945 Referring Provider (PT): Dr. Lowella Petties   Encounter Date: 08/24/2019  PT End of Session - 08/24/19 1323    Visit Number  7    Number of Visits  12    Authorization Type  UHC, MCR    PT Start Time  1300    PT Stop Time  1350    PT Time Calculation (min)  50 min    Activity Tolerance  Patient tolerated treatment well    Behavior During Therapy  [facility] for tasks assessed/performed       Past Medical History:  Diagnosis Date  . Anxiety   . Depression   . GERD (gastroesophageal reflux disease)   . Glaucoma    left eye got bacterial infection  . 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 Macular hole of both eyes     Past Surgical History:  Procedure Laterality Date  . ABDOMINAL HYSTERECTOMY    . CHOLECYSTECTOMY    . EYE SURGERY     pt reports 10 eye surgeries  . KNEE ARTHROSCOPY Right 03/04/2017   Procedure: ARTHROSCOPY KNEE;  Surgeon: Melrose Nakayama, MD;  Location: [place];  Service: Orthopedics;  Laterality: Right;  . PARS PLANA VITRECTOMY Left 04/12/2015   Procedure: PARS PLANA VITRECTOMY 25 GAUGE FOR ENDOPHTHALMITIS/VITREOUS TAP/INJECTION OF ANTIBOTICS, REVISION OF OPERATIVE WOUND, CONJUNCTIVA FLAP;  Surgeon: Hurman Horn, MD;  Location: [place];  Service: Ophthalmology;  Laterality: Left;  . TUBAL LIGATION      There were no vitals filed for this visit.  Subjective Assessment - 08/24/19 1306    Subjective  COVID 19 screening performed on patient upon arrival.  Reports doing real well with therapy and always feels better after.    Pertinent History  s/p L knee arthroscopy 07/14/2019, R knee arthroscopy 2019    Patient Stated Goals  to use my leg better and not be sore    Currently  in Pain?  No/denies         [facility] PT Assessment - 08/24/19 0001      Assessment   Medical Diagnosis  L knee arthroscopy 07/14/2019    Referring Provider (PT)  Dr. Lowella Petties    Onset Date/Surgical Date  07/14/19    Hand Dominance  Right    Next MD Visit  09/2019    Prior Therapy  yes, for R knee arthroscopy      Precautions   Precautions  None      Restrictions   Weight Bearing Restrictions  No                   [facility] Adult PT Treatment/Exercise - 08/24/19 0001      Knee/Hip Exercises: Aerobic   Stationary Bike  Bike seat 2 L3 x 10 mins    Nustep  L5 x10 min      Knee/Hip Exercises: Standing   Lateral Step Up  Both;10 reps;Hand Hold: 2;Step Height: 6"    Rocker Board  Other (comment);3 minutes   x3 mins stretching 1x3 mins for balance     Modalities   Modalities  Vasopneumatic      Vasopneumatic   Number Minutes Vasopneumatic   15 minutes    Vasopnuematic Location  Knee    Vasopneumatic Pressure  Medium    Vasopneumatic Temperature   34                  PT Long Term Goals - 08/19/19 1150      PT LONG TERM GOAL #1   Title  Pt will be independent in her HEP and progression.    Time  6    Period  Weeks    Status  On-going      PT LONG TERM GOAL #2   Title  Patient to amb community distances with a normal gait pattern with no pain.    Period  Weeks    Status  Achieved      PT LONG TERM GOAL #3   Title  Pt will improve her 5 x sit to stand with no UE support in </= 15 seconds.    Baseline  18.5 seconds with UE support    Time  6    Period  Weeks    Status  Achieved      PT LONG TERM GOAL #4   Title  Pt will improve her L knee flexion to >/= 120 degrees.    Baseline  98 degrees    Time  6    Period  Weeks    Status  Not Met   115 degrees     PT LONG TERM GOAL #5   Title  Pt will improve her FOTO score from 40% limitation to </= 25% limitation.    Time  6    Period  Weeks    Status  On-going            Plan -  08/24/19 1354    Clinical Impression Statement  Patient responded very well to therapy session with no increase reports of pain. Patient demonstrated good form and technique with all. Patient's FOTO limitation at 30%. No adverse affects upon removal of modalities.    Personal Factors and Comorbidities  Comorbidity 3+    Comorbidities  anxiety, depression, glaucoma, GERD, macular hole in both eyes, R knee arthroscopy 2019, L pars plana vitrectomy, hysterectomy    Examination-Activity Limitations  Squat;Stairs;Stand    Examination-Participation Restrictions  Community Activity;Yard Work    Stability/Clinical Decision Making  Stable/Uncomplicated    Designer, jewellery  Low    Rehab Potential  Excellent    PT Frequency  2x / week    PT Duration  6 weeks    PT Treatment/Interventions  ADLs/Self Care Home Management;Cryotherapy;Electrical Stimulation;Moist Heat;Ultrasound;Gait training;Stair training;Functional mobility training;Therapeutic exercise;Therapeutic activities;Patient/family education;Manual techniques;Passive range of motion;Taping;Vasopneumatic Device    PT Next Visit Plan  bike, LE strengthening, SLS activities, L knee ROM exercises, Vasopneumatic    Consulted and Agree with Plan of Care  Patient       Patient will benefit from skilled therapeutic intervention in order to improve the following deficits and impairments:  Postural dysfunction, Decreased strength, Decreased activity tolerance, Impaired flexibility, Decreased range of motion, Increased edema, Difficulty walking  Visit Diagnosis: Acute pain of left knee  Difficulty in walking, not elsewhere classified  Muscle weakness (generalized)  Localized edema     Problem List There are no problems to display for this patient.   Gabriela Eves, PT, DPT 08/24/2019, 1:59 PM  [facility] [place], [place], [address] Phone: [phone]   Fax:   [phone]  Name: Veronica Bolton Espiritu MRN: [id number] Date of Birth: February 21, 1946

## 2019-08-31 ENCOUNTER — Other Ambulatory Visit: Payer: Self-pay

## 2019-08-31 ENCOUNTER — Ambulatory Visit: Payer: Medicare Other | Admitting: *Deleted

## 2019-08-31 DIAGNOSIS — M25562 Pain in left knee: Secondary | ICD-10-CM | POA: Diagnosis not present

## 2019-08-31 DIAGNOSIS — R262 Difficulty in walking, not elsewhere classified: Secondary | ICD-10-CM

## 2019-08-31 NOTE — Therapy (Addendum)
[facility] [place], [place], [address] Phone: [phone]   Fax:  [phone]  Physical Therapy Treatment PHYSICAL THERAPY DISCHARGE SUMMARY  Visits from Start of Care: 8  Current functional level related to goals / functional outcomes: See below   Remaining deficits: See goals   Education / Equipment: HEP  Plan: Patient agrees to discharge.  Patient goals were met. Patient is being discharged due to being pleased with the current functional level.  ?????     Patient Details  Name: Veronica Rojas MRN: [id number] Date of Birth: March 20, 1946 Referring Provider (PT): Dr. Lowella Petties   Encounter Date: 08/31/2019  PT End of Session - 08/31/19 1130    Visit Number  8    Number of Visits  12    Authorization Type  UHC, MCRFOTO 8th visit 35% limited    PT Start Time  1115    PT Stop Time  1205    PT Time Calculation (min)  50 min       Past Medical History:  Diagnosis Date  . Anxiety   . Depression   . GERD (gastroesophageal reflux disease)   . Glaucoma    left eye got bacterial infection  . 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 Macular hole of both eyes     Past Surgical History:  Procedure Laterality Date  . ABDOMINAL HYSTERECTOMY    . CHOLECYSTECTOMY    . EYE SURGERY     pt reports 10 eye surgeries  . KNEE ARTHROSCOPY Right 03/04/2017   Procedure: ARTHROSCOPY KNEE;  Surgeon: Melrose Nakayama, MD;  Location: [place];  Service: Orthopedics;  Laterality: Right;  . PARS PLANA VITRECTOMY Left 04/12/2015   Procedure: PARS PLANA VITRECTOMY 25 GAUGE FOR ENDOPHTHALMITIS/VITREOUS TAP/INJECTION OF ANTIBOTICS, REVISION OF OPERATIVE WOUND, CONJUNCTIVA FLAP;  Surgeon: Hurman Horn, MD;  Location: [place];  Service: Ophthalmology;  Laterality: Left;  . TUBAL LIGATION      There were no vitals filed for this visit.  Subjective Assessment - 08/31/19 1136    Subjective   COVID 19 screening performed on patient upon arrival.  Did a lot of walking thiis weekend and knee is mainly sore.    Pertinent History  s/p L knee arthroscopy 07/14/2019, R knee arthroscopy 2019    Patient Stated Goals  to use my leg better and not be sore    Currently in Pain?  No/denies    Pain Score  2     Pain Location  Knee    Pain Orientation  Right    Pain Descriptors / Indicators  Sore    Pain Type  Surgical pain    Pain Onset  1 to 4 weeks ago                       [facility] Adult PT Treatment/Exercise - 08/31/19 0001      Knee/Hip Exercises: Aerobic   Stationary Bike  Bike seat 2 L3 x 10 mins    Nustep  L5 x10 min      Knee/Hip Exercises: Standing   Lateral Step Up  Both;10 reps;Hand Hold: 2;Step Height: 6"    Forward Step Up  Left;10 reps;Hand Hold: 2;3 Systems analyst  Other (comment);3 minutes   x3 mins stretching 1x3 mins for balance     Modalities   Modalities  Vasopneumatic      Vasopneumatic  Number Minutes Vasopneumatic   15 minutes    Vasopnuematic Location   Knee    Vasopneumatic Pressure  Medium    Vasopneumatic Temperature   34                  PT Long Term Goals - 08/31/19 1144      PT LONG TERM GOAL #1   Title  Pt will be independent in her HEP and progression.    Period  Weeks    Status  Achieved      PT LONG TERM GOAL #2   Title  Patient to amb community distances with a normal gait pattern with no pain.    Time  6    Period  Weeks    Status  Achieved      PT LONG TERM GOAL #3   Title  Pt will improve her 5 x sit to stand with no UE support in </= 15 seconds.    Baseline  18.5 seconds with UE support    Time  6    Period  Weeks    Status  Achieved      PT LONG TERM GOAL #4   Baseline  98 degrees    Time  6    Period  Weeks    Status  Achieved      PT LONG TERM GOAL #5   Title  Pt will improve her FOTO score from 40% limitation to </= 25% limitation.    Time  6    Period  Weeks    Status  Achieved             Plan - 08/31/19 1217    Clinical Impression Statement  Pt arrived today doing fairly well. She reports doing a lot of walking over the weekend and has mainly just soreness LT knee. She returns to MD today with possible DC. She has currently met all LTGs and FOTO was 25% limitation.    Personal Factors and Comorbidities  Comorbidity 3+    Comorbidities  anxiety, depression, glaucoma, GERD, macular hole in both eyes, R knee arthroscopy 2019, L pars plana vitrectomy, hysterectomy    Examination-Activity Limitations  Squat;Stairs;Stand    Examination-Participation Restrictions  Community Activity;Yard Work    Environmental manager    PT Frequency  2x / week    PT Duration  6 weeks    PT Treatment/Interventions  ADLs/Self Care Home Management;Cryotherapy;Electrical Stimulation;Moist Heat;Ultrasound;Gait training;Stair training;Functional mobility training;Therapeutic exercise;Therapeutic activities;Patient/family education;Manual techniques;Passive range of motion;Taping;Vasopneumatic Device    PT Next Visit Plan  To MD today with possible DC       Patient will benefit from skilled therapeutic intervention in order to improve the following deficits and impairments:  Postural dysfunction, Decreased strength, Decreased activity tolerance, Impaired flexibility, Decreased range of motion, Increased edema, Difficulty walking  Visit Diagnosis: Acute pain of left knee  Difficulty in walking, not elsewhere classified     Problem List There are no problems to display for this patient.   Akiba Melfi,CHRIS, PTA 08/31/2019, 12:26 PM  [facility] [place], [place], [address] Phone: [phone]   Fax:  [phone]  Name: Veronica Bolton Starzyk MRN: [id number] Date of Birth: Jun 05, 1945

## 2019-12-14 ENCOUNTER — Encounter: Payer: Self-pay | Admitting: Internal Medicine

## 2020-02-15 ENCOUNTER — Ambulatory Visit: Payer: Medicare Other | Admitting: Internal Medicine

## 2020-10-29 ENCOUNTER — Emergency Department (HOSPITAL_COMMUNITY): Payer: Medicare Other

## 2020-10-29 ENCOUNTER — Other Ambulatory Visit: Payer: Self-pay

## 2020-10-29 ENCOUNTER — Encounter (HOSPITAL_COMMUNITY): Payer: Self-pay | Admitting: *Deleted

## 2020-10-29 ENCOUNTER — Emergency Department (HOSPITAL_COMMUNITY)
Admission: EM | Admit: 2020-10-29 | Discharge: 2020-10-29 | Disposition: A | Discharge status: Home / Self Care | Payer: Medicare Other | Attending: Emergency Medicine | Admitting: Emergency Medicine

## 2020-10-29 DIAGNOSIS — R42 Dizziness and giddiness: Secondary | ICD-10-CM | POA: Diagnosis not present

## 2020-10-29 DIAGNOSIS — R0789 Other chest pain: Secondary | ICD-10-CM | POA: Diagnosis present

## 2020-10-29 DIAGNOSIS — R079 Chest pain, unspecified: Secondary | ICD-10-CM

## 2020-10-29 LAB — COMPREHENSIVE METABOLIC PANEL
ALT: 17 U/L (ref 0–44)
AST: 28 U/L (ref 15–41)
Albumin: 4 g/dL (ref 3.5–5.0)
Alkaline Phosphatase: 94 U/L (ref 38–126)
Anion gap: 8 (ref 5–15)
BUN: 14 mg/dL (ref 8–23)
CO2: 25 mmol/L (ref 22–32)
Calcium: 8.8 mg/dL — ABNORMAL LOW (ref 8.9–10.3)
Chloride: 105 mmol/L (ref 98–111)
Creatinine, Ser: 0.86 mg/dL (ref 0.44–1.00)
GFR, Estimated: >60 mL/min (ref >60)
Glucose, Bld: 104 mg/dL — ABNORMAL HIGH (ref 70–99)
Potassium: 4.1 mmol/L (ref 3.5–5.1)
Sodium: 138 mmol/L (ref 135–145)
Total Bilirubin: 1.2 mg/dL (ref 0.3–1.2)
Total Protein: 6.4 g/dL — ABNORMAL LOW (ref 6.5–8.1)

## 2020-10-29 LAB — CBC WITH DIFFERENTIAL/PLATELET
Abs Immature Granulocytes: 0.01 10*3/uL (ref 0.00–0.07)
Basophils Absolute: 0.1 10*3/uL (ref 0.0–0.1)
Basophils Relative: 1 %
Eosinophils Absolute: 0.3 10*3/uL (ref 0.0–0.5)
Eosinophils Relative: 4 %
HCT: 40.5 % (ref 36.0–46.0)
Hemoglobin: 13.6 g/dL (ref 12.0–15.0)
Immature Granulocytes: 0 %
Lymphocytes Relative: 24 %
Lymphs Abs: 1.5 10*3/uL (ref 0.7–4.0)
MCH: 32 pg (ref 26.0–34.0)
MCHC: 33.6 g/dL (ref 30.0–36.0)
MCV: 95.3 fL (ref 80.0–100.0)
Monocytes Absolute: 0.8 10*3/uL (ref 0.1–1.0)
Monocytes Relative: 13 %
Neutro Abs: 3.7 10*3/uL (ref 1.7–7.7)
Neutrophils Relative %: 58 %
Platelets: 251 10*3/uL (ref 150–400)
RBC: 4.25 MIL/uL (ref 3.87–5.11)
RDW: 13.2 % (ref 11.5–15.5)
WBC: 6.3 10*3/uL (ref 4.0–10.5)
nRBC: 0 % (ref 0.0–0.2)

## 2020-10-29 LAB — TROPONIN I (HIGH SENSITIVITY)
Troponin I (High Sensitivity): 3 ng/L (ref <18)
Troponin I (High Sensitivity): 3 ng/L (ref <18)

## 2020-10-29 MED ORDER — NITROGLYCERIN 0.4 MG SL SUBL
0.4000 mg | SUBLINGUAL_TABLET | SUBLINGUAL | Status: DC | PRN
  Administered 2020-10-29: 14:00:00 0.4 mg via SUBLINGUAL

## 2020-10-29 MED ORDER — NITROGLYCERIN 0.4 MG SL SUBL
SUBLINGUAL_TABLET | SUBLINGUAL | Status: AC
  Filled 2020-10-29: qty 1

## 2020-10-29 NOTE — ED Triage Notes (Signed)
Pt with sudden mid CP while cooking this morning with some lightheaded, denies any N/V or diaphoresis , hard to take a breath. Pt took 324 mg ASA before EMS arrived and one SL NTG was given en route here for CP.  Pain from 7/10 to 1/10

## 2020-10-29 NOTE — Discharge Instructions (Signed)
You have been seen and discharged from the emergency department.  Follow-up with your primary provider for reevaluation and further care. Take home medications as prescribed. If you have any worsening symptoms or further concerns for your health please return to an emergency department for further evaluation. 

## 2020-10-29 NOTE — ED Provider Notes (Signed)
[facility] EMERGENCY DEPARTMENT Provider Note   CSN: [id number] Arrival date & time: 10/29/20  1237     History Chief Complaint  Patient presents with   Chest Pain    Veronica Rojas is a 75 y.o. female.  HPI  75 year old female with past medical history of HLD presents to the emergency department with concern for chest pain.  Patient states while she was cooking food this morning she had onset of midsternal chest pain associated with some lightheadedness.  It became persistent and worse so she called an ambulance.  The pain does not radiate to her jaw/arm/back/abdomen.  They gave her aspirin nitroglycerin she states that this significantly helped the chest pain.  Denies any ongoing shortness of breath.  No recent fever, cough.  No swelling of her lower extremities.  No history of cardiac disease, she has never been seen by a cardiologist or had a work-up.  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   Past Surgical History:  Procedure Laterality Date   ABDOMINAL HYSTERECTOMY     CHOLECYSTECTOMY     EYE SURGERY     pt reports 10 eye surgeries   KNEE ARTHROSCOPY Right 03/04/2017   Procedure: ARTHROSCOPY KNEE;  Surgeon: Marcene Corning, MD;  Location: [facility];  Service: Orthopedics;  Laterality: Right;   PARS PLANA VITRECTOMY Left 04/12/2015   Procedure: PARS PLANA VITRECTOMY 25 GAUGE FOR ENDOPHTHALMITIS/VITREOUS TAP/INJECTION OF ANTIBOTICS, REVISION OF OPERATIVE WOUND, CONJUNCTIVA FLAP;  Surgeon: Edmon Crape, MD;  Location: [facility];  Service: Ophthalmology;  Laterality: Left;   TUBAL LIGATION       OB History   No obstetric history on file.     Family History  Problem  Relation Age of Onset   Stroke Mother    Diabetes Mother    Stroke Brother    Diabetes Brother    Breast cancer Neg Hx     Social History   Tobacco Use   Smoking status: Never   Smokeless tobacco: Never  Vaping Use   Vaping Use: Never used  Substance Use Topics   Alcohol use: Yes    Comment: ocassionally some wine   Drug use: No    Home Medications Prior to Admission medications   Medication Sig Start Date End Date Taking? Authorizing Provider  buPROPion (WELLBUTRIN XL) 300 MG 24 hr tablet Take 1 tablet by mouth every morning. 12/13/16   [provider]  citalopram (CELEXA) 20 MG tablet Take 1 tablet by mouth daily. 02/27/15   [provider]  HYDROcodone-acetaminophen (NORCO) 5-325 MG tablet Take 1-2 tablets by mouth every 4 (four) hours as needed for moderate pain. Patient not taking: Reported on 08/03/2019 03/04/17   Elodia Florence, PA-C  LUMIGAN 0.01 % SOLN Place 1 drop into the right eye at bedtime. 02/27/15   [provider]  pantoprazole (PROTONIX) 40 MG tablet Take 1 tablet by mouth daily as needed.  12/13/16   [provider]  pilocarpine (PILOCAR) 1 % ophthalmic solution Place 1 drop into the right eye 4 (four) times daily.  04/07/15   [provider]  Allergies    Codeine, Celebrex [celecoxib], and Sulfa antibiotics  Review of Systems   Review of Systems  Constitutional:  Negative for chills, diaphoresis and fever.  HENT:  Negative for congestion.   Eyes:  Negative for visual disturbance.  Respiratory:  Negative for shortness of breath.   Cardiovascular:  Positive for chest pain. Negative for palpitations and leg swelling.  Gastrointestinal:  Negative for abdominal pain, diarrhea and vomiting.  Genitourinary:  Negative for dysuria.  Musculoskeletal:  Negative for back pain and neck pain.  Skin:  Negative for rash.  Neurological:  Positive for light-headedness. Negative for headaches.   Physical Exam Updated Vital  Signs BP 136/78   Pulse 63   Temp 97.6 F (36.4 C) (Oral)   Resp 17   Ht 5\' 3"  (1.6 m)   Wt 81.6 kg   SpO2 99%   BMI 31.89 kg/m   Physical Exam Vitals and nursing note reviewed.  Constitutional:      Appearance: Normal appearance.  HENT:     Head: Normocephalic.     Mouth/Throat:     Mouth: Mucous membranes are moist.  Cardiovascular:     Rate and Rhythm: Normal rate.  Pulmonary:     Effort: Pulmonary effort is normal. No respiratory distress.  Chest:     Chest wall: No crepitus.  Abdominal:     Palpations: Abdomen is soft.     Tenderness: There is no abdominal tenderness.  Musculoskeletal:     Right lower leg: No edema.     Left lower leg: No edema.  Skin:    General: Skin is warm.  Neurological:     Mental Status: She is alert and oriented to person, place, and time. Mental status is at baseline.  Psychiatric:        Mood and Affect: Mood normal.    ED Results / Procedures / Treatments   Labs (all labs ordered are listed, but only abnormal results are displayed) Labs Reviewed  CBC WITH DIFFERENTIAL/PLATELET  COMPREHENSIVE METABOLIC PANEL  TROPONIN I (HIGH SENSITIVITY)    EKG EKG Interpretation  Date/Time:  Sunday October 29 2020 12:40:57 EDT Ventricular Rate:  55 PR Interval:  164 QRS Duration: 100 QT Interval:  502 QTC Calculation: 481 R Axis:   81 Text Interpretation: Sinus rhythm Borderline right axis deviation Borderline repolarization abnormality NSR, no change from previous Confirmed by Tamim Skog ([id number]) on 10/29/2020 12:54:50 PM  Radiology No results found.  Procedures Procedures   Medications Ordered in ED Medications  nitroGLYCERIN (NITROSTAT) SL tablet 0.4 mg (0 mg Sublingual Hold 10/29/20 1348)    ED Course  I have reviewed the triage vital signs and the nursing notes.  Pertinent labs & imaging results that were available during my care of the patient were reviewed by me and considered in my medical decision making (see chart for  details).    MDM Rules/Calculators/A&P                          74  year old female presents the emergency department after 1 episode of chest pain.  Patient was given nitroglycerin, currently her chest pain is improved/resolved.  Vitals are stable on arrival, no active chest pain.  There was never any radiation to the neck, arm or back/abdomen.  EKG is unchanged for her.  Chest x-ray is unremarkable.  Blood work is reassuring, troponin is 3 and 3, no delta. Low suspicion for active ACS given very reassuring work up.  She is a heart score of 3, no indication for emergent admission/testing.  She has remained chest pain-free.  Low suspicion for PE given no associated shortness of breath/tachycardia/hypoxia.  Low suspicion for dissection given the pain pattern and resolved discomfort.  Patient will require further cardiac work-up but not an emergent/admission setting.  Patient will be discharged and treated as an outpatient.  Discharge plan and strict return to ED precautions discussed, patient verbalizes understanding and agreement.  Final Clinical Impression(s) / ED Diagnoses Final diagnoses:  None    Rx / DC Orders ED Discharge Orders     None        Rozelle Logan, DO 10/29/20 1609

## 2021-01-29 ENCOUNTER — Other Ambulatory Visit: Payer: Self-pay | Admitting: Family Medicine

## 2021-01-29 DIAGNOSIS — Z1231 Encounter for screening mammogram for malignant neoplasm of breast: Secondary | ICD-10-CM

## 2021-01-29 DIAGNOSIS — E2839 Other primary ovarian failure: Secondary | ICD-10-CM

## 2021-07-10 ENCOUNTER — Ambulatory Visit: Payer: Medicare Other

## 2021-07-10 ENCOUNTER — Other Ambulatory Visit: Payer: Medicare Other

## 2022-02-18 ENCOUNTER — Other Ambulatory Visit: Payer: Self-pay | Admitting: Family Medicine

## 2022-02-18 DIAGNOSIS — Z1231 Encounter for screening mammogram for malignant neoplasm of breast: Secondary | ICD-10-CM

## 2022-02-18 DIAGNOSIS — E2839 Other primary ovarian failure: Secondary | ICD-10-CM

## 2022-05-15 ENCOUNTER — Ambulatory Visit
Admission: RE | Admit: 2022-05-15 | Discharge: 2022-05-15 | Disposition: A | Discharge status: Home / Self Care | Payer: Medicare Other | Source: Ambulatory Visit | Attending: Family Medicine | Admitting: Family Medicine

## 2022-05-15 DIAGNOSIS — Z1231 Encounter for screening mammogram for malignant neoplasm of breast: Secondary | ICD-10-CM

## 2022-08-16 ENCOUNTER — Inpatient Hospital Stay: Admission: RE | Admit: 2022-08-16 | Payer: Medicare Other | Source: Ambulatory Visit

## 2022-09-26 IMAGING — DX DG CHEST 1V PORT
1 series · 1 of 1 positions shown · non-contrast
Comparison: None.

CLINICAL DATA: Chest pain

EXAM:
PORTABLE CHEST 1 VIEW

[chest ap]
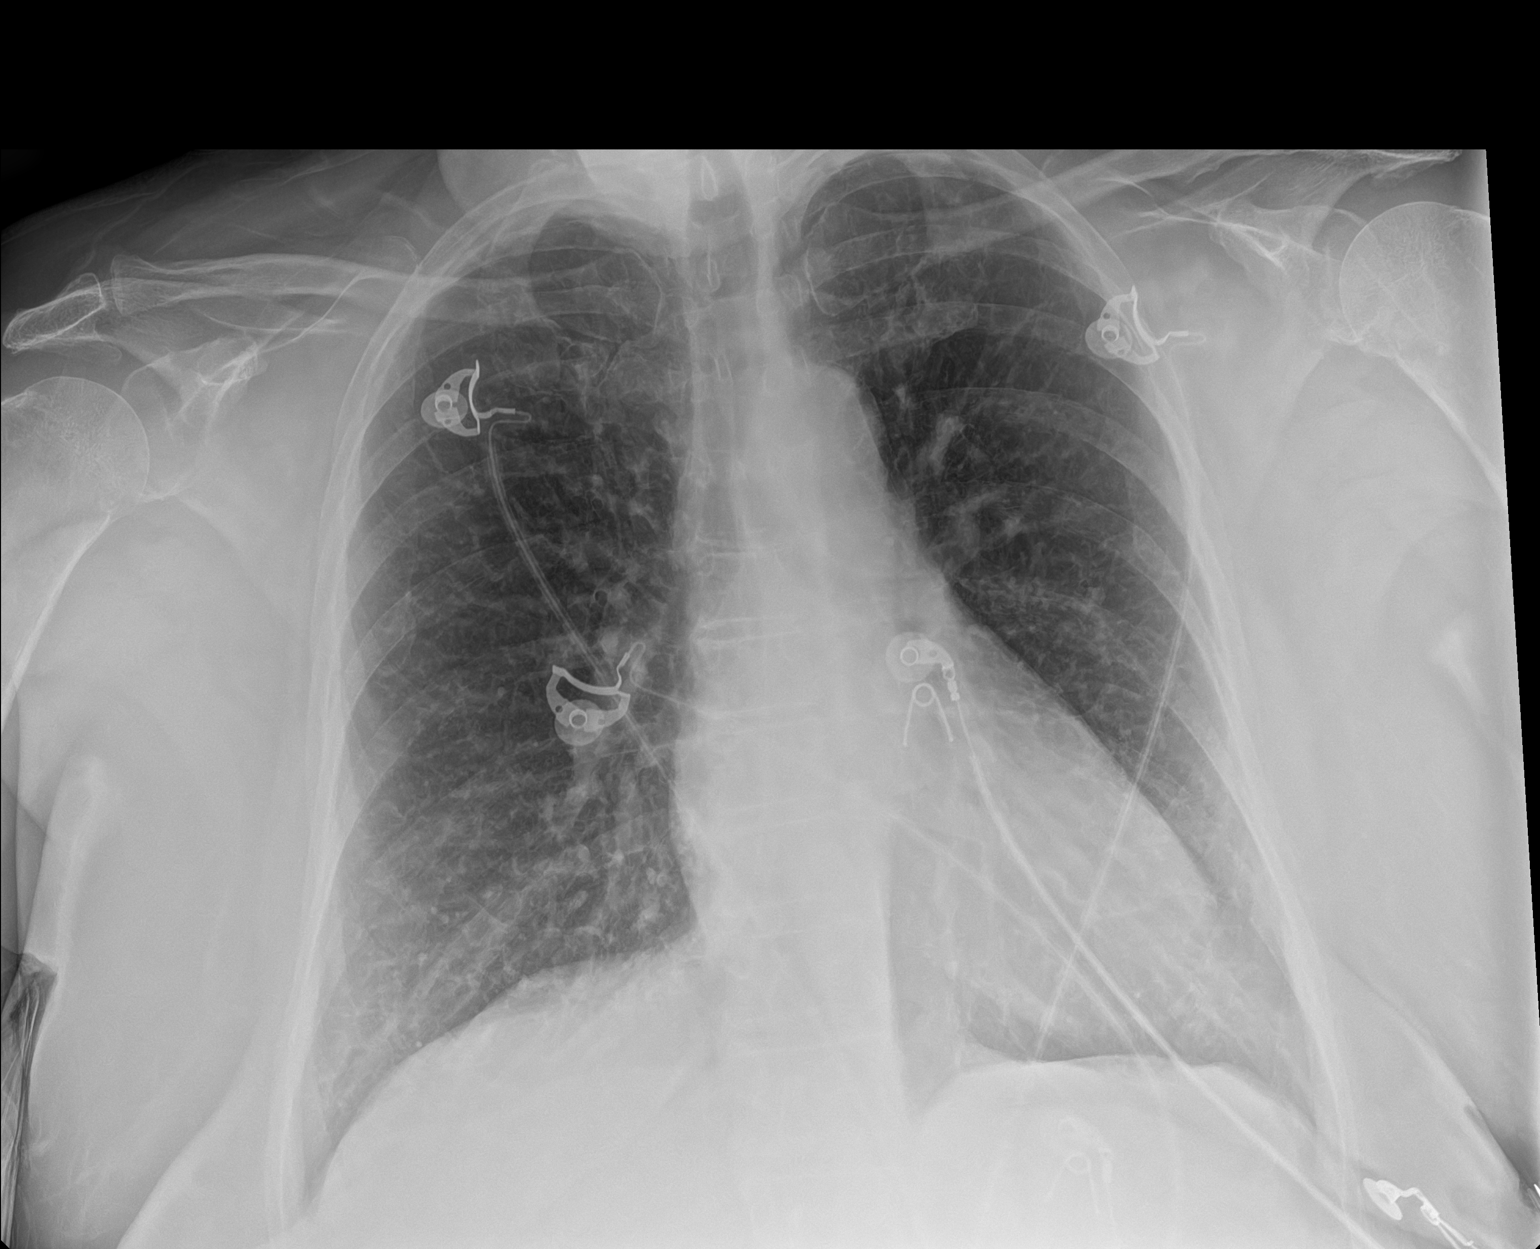

[1 of 1 positions shown; findings below may reference images not displayed]

FINDINGS: The heart size and mediastinal contours are within normal limits.
Both lungs are clear. The visualized skeletal structures are
unremarkable.
IMPRESSION: No acute abnormality of the lungs in AP portable projection.

## 2023-02-25 ENCOUNTER — Other Ambulatory Visit: Payer: Self-pay | Admitting: Family Medicine

## 2023-02-25 DIAGNOSIS — E2839 Other primary ovarian failure: Secondary | ICD-10-CM

## 2023-02-25 DIAGNOSIS — Z1231 Encounter for screening mammogram for malignant neoplasm of breast: Secondary | ICD-10-CM

## 2023-08-11 ENCOUNTER — Ambulatory Visit: Payer: Medicare Other | Attending: Orthopedic Surgery

## 2023-08-11 DIAGNOSIS — M6281 Muscle weakness (generalized): Secondary | ICD-10-CM | POA: Diagnosis present

## 2023-08-11 DIAGNOSIS — M25611 Stiffness of right shoulder, not elsewhere classified: Secondary | ICD-10-CM | POA: Diagnosis present

## 2023-08-11 DIAGNOSIS — M25511 Pain in right shoulder: Secondary | ICD-10-CM | POA: Diagnosis present

## 2023-08-11 NOTE — Therapy (Signed)
 OUTPATIENT PHYSICAL THERAPY SHOULDER EVALUATION   Patient Name: Veronica Rojas MRN: [id number] DOB:03/04/1946, 78 y.o., female Today's Date: 08/11/2023  END OF SESSION:  PT End of Session - 08/11/23 1022     Visit Number 1    Number of Visits 12    Date for PT Re-Evaluation 10/31/23    PT Start Time 1023    PT Stop Time 1052    PT Time Calculation (min) 29 min    Activity Tolerance Patient tolerated treatment well    Behavior During Therapy WFL for tasks assessed/performed             Past Medical History:  Diagnosis Date   Anxiety    Depression    GERD (gastroesophageal reflux disease)    Glaucoma    left eye got bacterial infection   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   Hypercholesteremia    Macular hole of both eyes    Past Surgical History:  Procedure Laterality Date   ABDOMINAL HYSTERECTOMY     CHOLECYSTECTOMY     EYE SURGERY     pt reports 10 eye surgeries   KNEE ARTHROSCOPY Right 03/04/2017   Procedure: ARTHROSCOPY KNEE;  Surgeon: Marcene Corning, MD;  Location: [facility] OR;  Service: Orthopedics;  Laterality: Right;   PARS PLANA VITRECTOMY Left 04/12/2015   Procedure: PARS PLANA VITRECTOMY 25 GAUGE FOR ENDOPHTHALMITIS/VITREOUS TAP/INJECTION OF ANTIBOTICS, REVISION OF OPERATIVE WOUND, CONJUNCTIVA FLAP;  Surgeon: Edmon Crape, MD;  Location: [facility] OR;  Service: Ophthalmology;  Laterality: Left;   TUBAL LIGATION     There are no active problems to display for this patient.  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 She did not have any pain after surgery, but now she has some pain and soreness when she tries to use her right arm. She had a follow up with her surgeon this past Friday (08/08/23) and she was told that her shoulder was looking really good.  Hand dominance: Right  PERTINENT HISTORY: Anxiety, depression, glaucoma, and osteopenia  PAIN:  Are  you having pain? Yes: NPRS scale: Current: 5/10 Best: 5/10 Worst: 6/10 Pain location: right shoulder and upper arm Pain description: constant dull, and soreness Aggravating factors: using her arm Relieving factors: ice and medication  PRECAUTIONS: Shoulder  RED FLAGS: None   WEIGHT BEARING RESTRICTIONS: Yes ; no pressure on her right arm yet  FALLS:  Has patient fallen in last 6 months? No  LIVING ENVIRONMENT: Lives with: lives with their family Lives in: House/apartment Has following equipment at home: None  OCCUPATION: Retired  PLOF: Independent  PATIENT GOALS: be able color, paint, and do her puzzles  NEXT MD VISIT: 09/05/23  OBJECTIVE:  Note: Objective measures were completed at Evaluation unless otherwise noted.  PATIENT SURVEYS:  Quick Dash 65.9% disability  COGNITION: Overall cognitive status: Within functional limits for tasks assessed     SENSATION: Patient reports tingling in her right hand since her surgery  UPPER EXTREMITY ROM:   Active ROM Right eval Left eval  Shoulder flexion 15 PROM: 90 137  Shoulder extension    Shoulder abduction 37 PROM: 50 123  Shoulder adduction    Shoulder internal rotation    Shoulder external rotation PROM: 15 degrees at neutral   Elbow flexion    Elbow extension    Wrist flexion    Wrist extension    Wrist ulnar deviation    Wrist radial deviation    Wrist pronation    Wrist supination     (Blank rows = not tested)  UPPER EXTREMITY MMT: not tested due to surgical condition   JOINT MOBILITY TESTING:  R shoulder: hypomobile and painful  PALPATION:  TTP: right upper trapezius, supraspinatus, infraspinatus, surgical incision, triceps, and biceps                                                                                                                             TREATMENT DATE:                                    08/11/23 EXERCISE LOG  Exercise Repetitions and Resistance Comments  Table slides  8 reps    Elbow flexion  5 reps     Blank cell = exercise not performed today   PATIENT EDUCATION: Education details: HEP, POC, healing, prognosis, anatomy, and goals for physical therapy Person educated: Patient Education method: Explanation Education comprehension: verbalized understanding  HOME EXERCISE PROGRAM: Today's interventions were added to her HEP. She reported feeling comfortable with these interventions  ASSESSMENT:  CLINICAL IMPRESSION: Patient is a 78 y.o. female who was seen today for physical therapy evaluation and treatment following a right reverse total shoulder arthroplasty on 07/22/23. She presented with moderate pain severity and irritability with right shoulder active and passive range of motion reproducing her familiar symptoms. She also exhibited increased tenderness to palpation throughout her right shoulder musculature. She was provided a HEP which she was  able to properly demonstrate. She felt comfortable with these interventions, but she declined a handout. Recommend that she continue with skilled physical therapy to address her remaining impairments to return to her prior level of function.    OBJECTIVE IMPAIRMENTS: decreased activity tolerance, decreased ROM, decreased strength, hypomobility, impaired sensation, impaired tone, impaired UE functional use, and pain.   ACTIVITY LIMITATIONS: carrying, lifting, sleeping, bed mobility, dressing, and  reach over head  PARTICIPATION LIMITATIONS: meal prep, cleaning, laundry, shopping, and community activity  PERSONAL FACTORS: 3+ comorbidities: Anxiety, depression, glaucoma, and osteopenia  are also affecting patient's functional outcome.   REHAB POTENTIAL: Good  CLINICAL DECISION MAKING: Evolving/moderate complexity  EVALUATION COMPLEXITY: Moderate   GOALS: Goals reviewed with patient? Yes  SHORT TERM GOALS: Target date: 09/01/23  Patient will be independent with her initial HEP.  Baseline: Goal status: INITIAL  2.  Patient will improve her Quick DASH score to 55% disability or less for improved function with her daily activities.  Baseline:  Goal status: INITIAL  3.  Patient will be able to demonstrate at least 90 degrees of right shoulder flexion for improved function painting.  Baseline:  Goal status: INITIAL  LONG TERM GOALS: Target date: 09/22/23  Patient will be independent with her advanced HEP.  Baseline:  Goal status: INITIAL  2.  Patient will improve her Quick DASH score to 45% disability or less for improved function with her daily activities.  Baseline:  Goal status: INITIAL  3.  Patient will be able to demonstrate at least 120 degrees of active right shoulder flexion for improved function reaching overhead.  Baseline:  Goal status: INITIAL  4.  Patient will be able to carry at least 5 pounds for improved function carrying her pocketbook.  Baseline:  Goal status: INITIAL  PLAN:  PT FREQUENCY: 2x/week  PT DURATION: 6 weeks  PLANNED INTERVENTIONS: 97164- PT Re-evaluation, 97110-Therapeutic exercises, 97530- Therapeutic activity, [id number]- Neuromuscular re-education, 97535- Self Care, 16109- Manual therapy, G0283- Electrical stimulation (unattended), 97016- Vasopneumatic device, Patient/Family education, Joint mobilization, Cryotherapy, and Moist heat  PLAN FOR NEXT SESSION: PROM, AAROM, isometrics, manual therapy, and modalities as needed   Granville Lewis, PT 08/11/2023, 6:54 PM

## 2023-08-13 ENCOUNTER — Ambulatory Visit

## 2023-08-13 DIAGNOSIS — M25611 Stiffness of right shoulder, not elsewhere classified: Secondary | ICD-10-CM

## 2023-08-13 DIAGNOSIS — M25511 Pain in right shoulder: Secondary | ICD-10-CM | POA: Diagnosis not present

## 2023-08-13 DIAGNOSIS — M6281 Muscle weakness (generalized): Secondary | ICD-10-CM

## 2023-08-13 NOTE — Therapy (Signed)
 OUTPATIENT PHYSICAL THERAPY SHOULDER TREATMENT   Patient Name: Veronica Rojas MRN: [id number] DOB:07/27/1945, 78 y.o., female Today's Date: 08/13/2023  END OF SESSION:  PT End of Session - 08/13/23 1603     Visit Number 2    Number of Visits 12    Date for PT Re-Evaluation 10/31/23    PT Start Time 1600    PT Stop Time 1646    PT Time Calculation (min) 46 min    Activity Tolerance Patient tolerated treatment well    Behavior During Therapy WFL for tasks assessed/performed              Past Medical History:  Diagnosis Date   Anxiety    Depression    GERD (gastroesophageal reflux disease)    Glaucoma    left eye got bacterial infection   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   Hypercholesteremia    Macular hole of both eyes    Past Surgical History:  Procedure Laterality Date   ABDOMINAL HYSTERECTOMY     CHOLECYSTECTOMY     EYE SURGERY     pt reports 10 eye surgeries   KNEE ARTHROSCOPY Right 03/04/2017   Procedure: ARTHROSCOPY KNEE;  Surgeon: Marcene Corning, MD;  Location: [facility];  Service: Orthopedics;  Laterality: Right;   PARS PLANA VITRECTOMY Left 04/12/2015   Procedure: PARS PLANA VITRECTOMY 25 GAUGE FOR ENDOPHTHALMITIS/VITREOUS TAP/INJECTION OF ANTIBOTICS, REVISION OF OPERATIVE WOUND, CONJUNCTIVA FLAP;  Surgeon: Edmon Crape, MD;  Location: [facility];  Service: Ophthalmology;  Laterality: Left;   TUBAL LIGATION     There are no active problems to display for this patient.  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 She had to take a Tylenol this morning due to the soreness.  Hand dominance: Right  PERTINENT HISTORY: Anxiety, depression, glaucoma, and osteopenia  PAIN:  Are you having pain? Yes: NPRS scale: Current: 2/10 Best: 5/10 Worst: 6/10 Pain location: right shoulder and upper arm Pain description: constant dull, and soreness Aggravating factors: using her arm Relieving factors: ice and medication  PRECAUTIONS:  Shoulder  RED FLAGS: None   WEIGHT BEARING RESTRICTIONS: Yes ; no pressure on her right arm yet  FALLS:  Has patient fallen in last 6 months? No  LIVING ENVIRONMENT: Lives with: lives with their family Lives in: House/apartment Has following equipment at home: None  OCCUPATION: Retired  PLOF: Independent  PATIENT GOALS: be able color, paint, and do her puzzles  NEXT MD VISIT: 09/05/23  OBJECTIVE:  Note: Objective measures were completed at Evaluation unless otherwise noted.  PATIENT SURVEYS:  Quick Dash 65.9% disability  COGNITION: Overall cognitive status: Within functional limits for tasks assessed     SENSATION: Patient reports tingling in her right hand since her surgery  UPPER EXTREMITY ROM:   Active ROM Right eval Left eval  Shoulder flexion 15 PROM: 90 137  Shoulder extension    Shoulder abduction 37 PROM: 50 123  Shoulder adduction    Shoulder internal rotation    Shoulder external rotation PROM: 15 degrees at neutral   Elbow flexion    Elbow extension    Wrist flexion    Wrist extension    Wrist ulnar deviation    Wrist radial deviation    Wrist pronation    Wrist supination    (Blank rows = not tested)  UPPER EXTREMITY MMT: not tested due to surgical condition   JOINT MOBILITY TESTING:  R shoulder: hypomobile and painful  PALPATION:  TTP: right upper trapezius, supraspinatus,  infraspinatus, surgical incision, triceps, and biceps                                                                                                                             TREATMENT DATE:                                    08/13/23 EXERCISE LOG  Exercise Repetitions and Resistance Comments  Pulleys  4 minutes  For passive R shoulder flexion  Scapular retraction  20 reps    Bicep curl  30 reps  RUE only   Theraputty squeeze  Yellow t-putty x 3 minutes RUE only        Blank cell = exercise not performed today  Manual Therapy Soft Tissue Mobilization: right deltoid, upper trapezius, and supraspinatus, for reduced pain and tone   Modalities: no adverse reaction to today's modalities  Date:  Vaso: Shoulder, 34 degrees; low pressure, 15 mins, Pain                                   08/11/23 EXERCISE LOG  Exercise Repetitions and Resistance Comments  Table slides  8 reps    Elbow flexion  5 reps     Blank cell = exercise not performed today   PATIENT EDUCATION: Education details: HEP,  healing, and prognosis  Person educated: Patient Education method:  Explanation Education comprehension: verbalized understanding  HOME EXERCISE PROGRAM: Today's interventions were added to her HEP. She reported feeling comfortable with these interventions  ASSESSMENT:  CLINICAL IMPRESSION: Patient was introduced to multiple new interventions for improved right shoulder mobility. She required minimal cueing with scapular retractions for proper exercise performance to facilitate periscapular engagement. Manual therapy focused on soft tissue mobilization to the right upper trapezius and surrounding musculature for reduced pain and tone with excellent effectiveness as she was not experiencing any pain or discomfort upon the conclusion of treatment. She continues to require skilled physical therapy to address her remaining impairments to return to her prior level of function.   OBJECTIVE IMPAIRMENTS:  decreased activity tolerance, decreased ROM, decreased strength, hypomobility, impaired sensation, impaired tone, impaired UE functional use, and pain.   ACTIVITY LIMITATIONS: carrying, lifting, sleeping, bed mobility, dressing, and reach over head  PARTICIPATION LIMITATIONS: meal prep, cleaning, laundry, shopping, and community activity  PERSONAL FACTORS: 3+ comorbidities: Anxiety, depression, glaucoma, and osteopenia  are also affecting patient's functional outcome.   REHAB POTENTIAL: Good  CLINICAL DECISION MAKING: Evolving/moderate complexity  EVALUATION COMPLEXITY: Moderate   GOALS: Goals reviewed with patient? Yes  SHORT TERM GOALS: Target date: 09/01/23  Patient will be independent with her initial HEP.  Baseline: Goal status: INITIAL  2.  Patient will improve her Quick DASH score to 55% disability or less for improved function with her daily activities.  Baseline:  Goal status: INITIAL  3.  Patient will be able to demonstrate at least 90 degrees of right shoulder flexion for improved function painting.  Baseline:  Goal status: INITIAL  LONG TERM GOALS: Target date: 09/22/23  Patient will be independent with her advanced HEP.  Baseline:  Goal status: INITIAL  2.  Patient will improve her Quick DASH score to 45% disability or less for improved function with her daily activities.  Baseline:  Goal status: INITIAL  3.  Patient will be able to demonstrate at least 120 degrees of active right shoulder flexion for improved function reaching overhead.  Baseline:  Goal status: INITIAL  4.  Patient will be able to carry at least 5 pounds for improved function carrying her pocketbook.  Baseline:  Goal status: INITIAL  PLAN:  PT FREQUENCY: 2x/week  PT DURATION: 6 weeks  PLANNED INTERVENTIONS: 97164- PT Re-evaluation, 97110-Therapeutic exercises, 97530- Therapeutic activity, [id number]- Neuromuscular re-education, 97535- Self Care, 16109- Manual therapy, G0283- Electrical  stimulation (unattended), 97016- Vasopneumatic device, Patient/Family education, Joint mobilization, Cryotherapy, and Moist heat  PLAN FOR NEXT SESSION: PROM, AAROM, isometrics, manual therapy, and modalities as needed   Granville Lewis, PT 08/13/2023, 5:24 PM

## 2023-08-18 ENCOUNTER — Ambulatory Visit

## 2023-08-18 DIAGNOSIS — M25611 Stiffness of right shoulder, not elsewhere classified: Secondary | ICD-10-CM

## 2023-08-18 DIAGNOSIS — M25511 Pain in right shoulder: Secondary | ICD-10-CM | POA: Diagnosis not present

## 2023-08-18 DIAGNOSIS — M6281 Muscle weakness (generalized): Secondary | ICD-10-CM

## 2023-08-18 NOTE — Therapy (Signed)
 OUTPATIENT PHYSICAL THERAPY SHOULDER TREATMENT   Patient Name: Veronica Rojas MRN: [id number] DOB:27-Jul-1945, 78 y.o., female Today's Date: 08/18/2023  END OF SESSION:  PT End of Session - 08/18/23 1018     Visit Number 3    Number of Visits 12    Date for PT Re-Evaluation 10/31/23    PT Start Time 1015    PT Stop Time 1114    PT Time Calculation (min) 59 min    Activity Tolerance Patient tolerated treatment well    Behavior During Therapy WFL for tasks assessed/performed              Past Medical History:  Diagnosis Date   Anxiety    Depression    GERD (gastroesophageal reflux disease)    Glaucoma    left eye got bacterial infection   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   Hypercholesteremia    Macular hole of both eyes    Past Surgical History:  Procedure Laterality Date   ABDOMINAL HYSTERECTOMY     CHOLECYSTECTOMY     EYE SURGERY     pt reports 10 eye surgeries   KNEE ARTHROSCOPY Right 03/04/2017   Procedure: ARTHROSCOPY KNEE;  Surgeon: Marcene Corning, MD;  Location: [facility];  Service: Orthopedics;  Laterality: Right;   PARS PLANA VITRECTOMY Left 04/12/2015   Procedure: PARS PLANA VITRECTOMY 25 GAUGE FOR ENDOPHTHALMITIS/VITREOUS TAP/INJECTION OF ANTIBOTICS, REVISION OF OPERATIVE WOUND, CONJUNCTIVA FLAP;  Surgeon: Edmon Crape, MD;  Location: [facility];  Service: Ophthalmology;  Laterality: Left;   TUBAL LIGATION     There are no active problems to display for this patient.  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  Pt reports that pain got up to 5/10 over the weekend.  Hand dominance: Right  PERTINENT HISTORY: Anxiety, depression, glaucoma, and osteopenia  PAIN:  Are you having pain? Yes: NPRS scale: 3/10 Pain location: right shoulder and upper arm Pain description: constant dull, and soreness Aggravating factors: using her arm Relieving factors: ice and medication  PRECAUTIONS: Shoulder  RED FLAGS: None   WEIGHT  BEARING RESTRICTIONS: Yes ; no pressure on her right arm yet  FALLS:  Has patient fallen in last 6 months? No  LIVING ENVIRONMENT: Lives with: lives with their family Lives in: House/apartment Has following equipment at home: None  OCCUPATION: Retired  PLOF: Independent  PATIENT GOALS: be able color, paint, and do her puzzles  NEXT MD VISIT: 09/05/23  OBJECTIVE:  Note: Objective measures were completed at Evaluation unless otherwise noted.  PATIENT SURVEYS:  Quick Dash 65.9% disability  COGNITION: Overall cognitive status: Within functional limits for tasks assessed     SENSATION: Patient reports tingling in her right hand since her surgery  UPPER EXTREMITY ROM:   Active ROM Right eval Left eval  Shoulder flexion 15 PROM: 90 137  Shoulder extension    Shoulder abduction 37 PROM: 50 123  Shoulder adduction    Shoulder internal rotation    Shoulder external rotation PROM: 15 degrees at neutral   Elbow flexion    Elbow extension    Wrist flexion    Wrist extension    Wrist ulnar deviation    Wrist radial deviation    Wrist pronation    Wrist supination    (Blank rows = not tested)  UPPER EXTREMITY MMT: not tested due to surgical condition   JOINT MOBILITY TESTING:  R shoulder: hypomobile and painful  PALPATION:  TTP: right upper trapezius, supraspinatus, infraspinatus, surgical incision, triceps, and  biceps                                                                                                                             TREATMENT DATE:                                    08/18/23 EXERCISE LOG  Exercise Repetitions and Resistance Comments  Pulleys  5 minutes  For passive R shoulder flexion  Ranger Flex/ext; CW and CCW circles x 2 mins each   Scapular retraction  25 reps    Bicep curl 3-way 1# x 20 reps each RUE only   Theraputty squeeze   RUE only   Thera-bar Up/Down Red x 20 reps each    Blank cell = exercise not performed today   Manual Therapy Soft Tissue Mobilization: right shoulder, STW/M to right deltoid for reduced pain and tone   Modalities: no adverse reaction to today's modalities  Date:  Vaso: Shoulder, 34 degrees; low pressure, 15 mins, Pain                                   08/11/23 EXERCISE LOG  Exercise Repetitions and Resistance Comments  Table slides  8 reps    Elbow flexion  5 reps     Blank cell = exercise not performed today   PATIENT EDUCATION: Education details: HEP,  healing,  and prognosis  Person educated: Patient Education method: Explanation Education comprehension: verbalized understanding  HOME EXERCISE PROGRAM: Today's interventions were added to her HEP. She reported feeling comfortable with these interventions  ASSESSMENT:  CLINICAL IMPRESSION: Pt arrives for today's treatment session reporting 3/10 right shoulder pain today.  Pt reports that pain got up to 5/10 over the weekend.  Pt introduced to UE Ranger today with good results and min cues for proper technique.  Pt also able to tolerate introduction to weighted 3-way bicep curls with min cues for proper technique.  STW/M performed to right deltoid to decrease pain and tone.  Normal responses to estim and vaso noted upon removal.  Pt reported 2/10 right shoulder pain at completion of today's treatment session.   OBJECTIVE IMPAIRMENTS: decreased activity tolerance, decreased ROM,  decreased strength, hypomobility, impaired sensation, impaired tone, impaired UE functional use, and pain.   ACTIVITY LIMITATIONS: carrying, lifting, sleeping, bed mobility, dressing, and reach over head  PARTICIPATION LIMITATIONS: meal prep, cleaning, laundry, shopping, and community activity  PERSONAL FACTORS: 3+ comorbidities: Anxiety, depression, glaucoma, and osteopenia  are also affecting patient's functional outcome.   REHAB POTENTIAL: Good  CLINICAL DECISION MAKING: Evolving/moderate complexity  EVALUATION COMPLEXITY: Moderate   GOALS: Goals reviewed with patient? Yes  SHORT TERM GOALS: Target date: 09/01/23  Patient will be independent with her initial HEP.  Baseline: Goal status: INITIAL  2.  Patient will improve her Quick DASH score to 55% disability or less for improved function with her daily activities.  Baseline:  Goal status: INITIAL  3.  Patient will be able to demonstrate at least 90 degrees of right shoulder flexion for improved function painting.  Baseline:  Goal status: INITIAL  LONG TERM GOALS: Target date: 09/22/23  Patient will be independent with her advanced HEP.  Baseline:  Goal status: INITIAL  2.  Patient will improve her Quick DASH score to 45% disability or less for improved function with her daily activities.  Baseline:  Goal status: INITIAL  3.  Patient will be able to demonstrate at least 120 degrees of active right shoulder flexion for improved function reaching overhead.  Baseline:  Goal status: INITIAL  4.  Patient will be able to carry at least 5 pounds for improved function carrying her pocketbook.  Baseline:  Goal status: INITIAL  PLAN:  PT FREQUENCY: 2x/week  PT DURATION: 6 weeks  PLANNED INTERVENTIONS: 97164- PT Re-evaluation, 97110-Therapeutic exercises, 97530- Therapeutic activity, 97112- Neuromuscular re-education, 97535- Self Care, 95284- Manual therapy, G0283- Electrical stimulation (unattended), 97016- Vasopneumatic  device, Patient/Family education, Joint mobilization, Cryotherapy, and Moist heat  PLAN FOR NEXT SESSION: PROM, AAROM, isometrics, manual therapy, and modalities as needed   Deryl Flora, PTA 08/18/2023, 11:18 AM

## 2023-08-20 ENCOUNTER — Ambulatory Visit

## 2023-08-20 DIAGNOSIS — M25511 Pain in right shoulder: Secondary | ICD-10-CM | POA: Diagnosis not present

## 2023-08-20 DIAGNOSIS — M6281 Muscle weakness (generalized): Secondary | ICD-10-CM

## 2023-08-20 DIAGNOSIS — M25611 Stiffness of right shoulder, not elsewhere classified: Secondary | ICD-10-CM

## 2023-08-20 NOTE — Therapy (Signed)
 OUTPATIENT PHYSICAL THERAPY SHOULDER TREATMENT   Patient Name: Veronica Rojas MRN: [id number] DOB:02/16/1946, 78 y.o., female Today's Date: 08/20/2023  END OF SESSION:  PT End of Session - 08/20/23 1032     Visit Number 4    Number of Visits 12    Date for PT Re-Evaluation 10/31/23    PT Start Time 1025   Patietn arrived late to her appointment.   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   Hypercholesteremia    Macular hole of both eyes    Past Surgical History:  Procedure Laterality Date   ABDOMINAL HYSTERECTOMY     CHOLECYSTECTOMY     EYE SURGERY     pt reports 10 eye surgeries   KNEE ARTHROSCOPY Right 03/04/2017   Procedure: ARTHROSCOPY KNEE;  Surgeon: Marcene Corning, MD;  Location: [facility] OR;  Service: Orthopedics;  Laterality: Right;   PARS PLANA VITRECTOMY Left 04/12/2015   Procedure: PARS PLANA VITRECTOMY 25 GAUGE FOR ENDOPHTHALMITIS/VITREOUS TAP/INJECTION OF ANTIBOTICS, REVISION OF OPERATIVE WOUND, CONJUNCTIVA FLAP;  Surgeon: Edmon Crape, MD;  Location: [facility] OR;  Service: Ophthalmology;  Laterality: Left;   TUBAL LIGATION     There are no active problems to display for this patient.  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   Hand dominance: Right  PERTINENT HISTORY: Anxiety, depression, glaucoma, and osteopenia  PAIN:  Are you having pain? Yes: NPRS scale: 2/10 Pain location: right shoulder and upper arm Pain description: constant dull, and soreness Aggravating factors: using her arm Relieving factors:  ice and medication  PRECAUTIONS: Shoulder  RED FLAGS: None   WEIGHT BEARING RESTRICTIONS: Yes ; no pressure on her right arm yet  FALLS:  Has patient fallen in last 6 months? No  LIVING ENVIRONMENT: Lives with: lives with their family Lives in: House/apartment Has following equipment at home: None  OCCUPATION: Retired  PLOF: Independent  PATIENT GOALS: be able color, paint, and do her puzzles  NEXT MD VISIT: 09/05/23  OBJECTIVE:  Note: Objective measures were completed at Evaluation unless otherwise noted.  PATIENT SURVEYS:  Quick Dash 65.9% disability  COGNITION: Overall cognitive status: Within functional limits for tasks assessed     SENSATION: Patient reports tingling in her right hand since her surgery  UPPER EXTREMITY ROM:   Active ROM Right eval Right 08/20/22 Left eval  Shoulder flexion 15 PROM: 90 82 137  Shoulder extension     Shoulder abduction 37 PROM: 50 70 123  Shoulder adduction     Shoulder internal rotation     Shoulder external rotation PROM: 15 degrees at neutral    Elbow flexion     Elbow extension     Wrist flexion     Wrist extension     Wrist ulnar deviation     Wrist radial deviation     Wrist pronation     Wrist supination     (Blank rows = not tested)  UPPER EXTREMITY MMT: not tested due to surgical condition   JOINT MOBILITY TESTING:  R shoulder: hypomobile and  painful  PALPATION:  TTP: right upper trapezius, supraspinatus, infraspinatus, surgical incision, triceps, and biceps                                                                                                                             TREATMENT DATE:                                    08/20/23 EXERCISE LOG  Exercise Repetitions and Resistance Comments  Pulleys  4 minutes   Manual therapy See below    UE ranger (seated) 2.5 minutes            Blank cell = exercise not performed today  Manual Therapy Soft Tissue Mobilization: scar mobilization, deltoid, biceps, and pectoralis minor, for reduced pain, tone and improved soft tissue extensibility   Modalities  Date:  Vaso: Shoulder, 34 degrees; low pressure, 15 mins, Pain and Tone                                   08/18/23 EXERCISE LOG  Exercise Repetitions and Resistance Comments  Pulleys  5 minutes  For passive R shoulder flexion  Ranger Flex/ext; CW and CCW circles x 2 mins each   Scapular retraction  25 reps    Bicep curl 3-way 1# x  20 reps each RUE only   Theraputty squeeze   RUE only   Thera-bar Up/Down Red x 20 reps each    Blank cell = exercise not performed today   Manual Therapy Soft Tissue Mobilization: right shoulder, STW/M to right deltoid for reduced pain and tone   Modalities: no adverse reaction to today's modalities  Date:  Vaso: Shoulder, 34 degrees; low pressure, 15 mins, Pain                                   08/11/23 EXERCISE LOG  Exercise Repetitions and Resistance Comments  Table slides  8 reps    Elbow flexion  5 reps     Blank cell = exercise not performed today   PATIENT EDUCATION: Education details: HEP,  healing, prognosis, objective findings, progress with physical therapy, and ice vs. heat Person educated: Patient Education method: Explanation Education comprehension: verbalized understanding  HOME EXERCISE PROGRAM: [id number]  ASSESSMENT:  CLINICAL IMPRESSION: Today's treatment  focused on familiar interventions for improved right shoulder mobility. Manual therapy focused on soft tissue mobilization to her right biceps and pectoralis minor being the most effective at reducing her familiar symptoms. She was able to demonstrate improved right shoulder active range of motion compared to her initial evaluation on 08/11/23. She reported that her shoulder felt good upon the conclusion of treatment. She continues to require skilled physical therapy to address her remaining impairments to return to her prior level of function.    OBJECTIVE IMPAIRMENTS: decreased activity tolerance, decreased ROM, decreased strength, hypomobility, impaired sensation, impaired tone, impaired UE functional use, and pain.   ACTIVITY LIMITATIONS: carrying, lifting, sleeping, bed mobility, dressing, and reach over head  PARTICIPATION LIMITATIONS: meal prep, cleaning, laundry, shopping, and community activity  PERSONAL FACTORS: 3+ comorbidities: Anxiety, depression, glaucoma, and osteopenia  are also affecting patient's functional outcome.   REHAB POTENTIAL: Good  CLINICAL DECISION MAKING: Evolving/moderate complexity  EVALUATION COMPLEXITY: Moderate   GOALS: Goals reviewed with patient? Yes  SHORT TERM GOALS: Target date: 09/01/23  Patient will be independent with her initial HEP.  Baseline: Goal status: INITIAL  2.  Patient will improve her Quick DASH score to 55% disability or less for improved function with her daily activities.  Baseline:  Goal status: INITIAL  3.  Patient will be able to demonstrate at least 90 degrees of right shoulder flexion for improved function painting.  Baseline:  Goal status: INITIAL  LONG TERM GOALS: Target date: 09/22/23  Patient will be independent with her advanced HEP.  Baseline:  Goal status: INITIAL  2.  Patient will improve her Quick DASH score to 45% disability or less for improved function with her daily activities.  Baseline:  Goal status:  INITIAL  3.  Patient will be able to demonstrate at least 120 degrees of active right shoulder flexion for improved function reaching overhead.  Baseline:  Goal status: INITIAL  4.  Patient will be able to carry at least 5 pounds for improved function carrying her pocketbook.  Baseline:  Goal status: INITIAL  PLAN:  PT FREQUENCY: 2x/week  PT DURATION: 6 weeks  PLANNED INTERVENTIONS: 97164- PT Re-evaluation, 97110-Therapeutic exercises, 97530- Therapeutic activity, [id number]- Neuromuscular re-education, 97535- Self Care, [id number]- Manual therapy, [id number]- Electrical stimulation (unattended), 97016- Vasopneumatic device, Patient/Family education, Joint mobilization, Cryotherapy, and Moist heat  PLAN FOR NEXT SESSION: PROM, AAROM, isometrics, manual therapy, and modalities as needed   Lane Pinon, PT  08/20/2023, 12:56 PM

## 2023-08-25 ENCOUNTER — Ambulatory Visit

## 2023-08-25 DIAGNOSIS — M25511 Pain in right shoulder: Secondary | ICD-10-CM | POA: Diagnosis not present

## 2023-08-25 DIAGNOSIS — M25611 Stiffness of right shoulder, not elsewhere classified: Secondary | ICD-10-CM

## 2023-08-25 DIAGNOSIS — M6281 Muscle weakness (generalized): Secondary | ICD-10-CM

## 2023-08-25 NOTE — Therapy (Signed)
 OUTPATIENT PHYSICAL THERAPY SHOULDER TREATMENT   Patient Name: Veronica Rojas MRN: [id number] DOB:Oct 28, 1945, 78 y.o., female Today's Date: 08/25/2023  END OF SESSION:  PT End of Session - 08/25/23 1025     Visit Number 5    Number of Visits 12    Date for PT Re-Evaluation 10/31/23    PT Start Time 1019    PT Stop Time 1132    PT Time Calculation (min) 73 min    Activity Tolerance Patient tolerated treatment well    Behavior During Therapy WFL for tasks assessed/performed                Past Medical History:  Diagnosis Date   Anxiety    Depression    GERD (gastroesophageal reflux disease)    Glaucoma    left eye got bacterial infection   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   Hypercholesteremia    Macular hole of both eyes    Past Surgical History:  Procedure Laterality Date   ABDOMINAL HYSTERECTOMY     CHOLECYSTECTOMY     EYE SURGERY     pt reports 10 eye surgeries   KNEE ARTHROSCOPY Right 03/04/2017   Procedure: ARTHROSCOPY KNEE;  Surgeon: Dayne Even, MD;  Location: [facility];  Service: Orthopedics;  Laterality: Right;   PARS PLANA VITRECTOMY Left 04/12/2015   Procedure: PARS PLANA VITRECTOMY 25 GAUGE FOR ENDOPHTHALMITIS/VITREOUS TAP/INJECTION OF ANTIBOTICS, REVISION OF OPERATIVE WOUND, CONJUNCTIVA FLAP;  Surgeon: Shon Downing, MD;  Location: [facility];  Service: Ophthalmology;  Laterality: Left;   TUBAL LIGATION     There are no active problems to display for this patient.  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 She can tell that her shoulder is getting better. However, she is tired today.   Hand dominance: Right  PERTINENT HISTORY: Anxiety, depression, glaucoma, and osteopenia  PAIN:  Are you having pain? Yes: NPRS scale: 0/10 Pain location: right shoulder and upper arm Pain description: constant dull, and soreness Aggravating factors: using her arm Relieving factors: ice and medication  PRECAUTIONS: Shoulder  RED FLAGS: None   WEIGHT BEARING RESTRICTIONS: Yes ; no pressure on her right arm yet  FALLS:  Has patient fallen in last 6 months? No  LIVING ENVIRONMENT: Lives with: lives with their family Lives in: House/apartment Has following equipment at home: None  OCCUPATION: Retired  PLOF: Independent  PATIENT GOALS: be able color, paint, and do her puzzles  NEXT MD VISIT: 09/05/23  OBJECTIVE:  Note: Objective measures were completed at Evaluation unless otherwise noted.  PATIENT SURVEYS:  Quick Dash 65.9% disability  COGNITION: Overall cognitive status: Within functional limits for tasks assessed     SENSATION: Patient reports tingling in her right hand since her surgery  UPPER EXTREMITY ROM:   Active ROM Right eval Right 08/20/22 Left eval  Shoulder flexion 15 PROM: 90 82 137  Shoulder extension     Shoulder abduction 37 PROM: 50 70 123  Shoulder adduction     Shoulder internal rotation     Shoulder external rotation PROM: 15 degrees at neutral    Elbow flexion     Elbow extension     Wrist flexion     Wrist extension     Wrist ulnar deviation     Wrist radial deviation     Wrist pronation     Wrist supination     (Blank rows = not tested)  UPPER EXTREMITY MMT: not tested due to surgical condition   JOINT MOBILITY TESTING:  R shoulder: hypomobile and painful  PALPATION:  TTP: right upper trapezius,  supraspinatus, infraspinatus, surgical incision, triceps, and biceps                                                                                                                             TREATMENT DATE:                                    08/25/23 EXERCISE LOG  Exercise Repetitions and Resistance Comments  Pulleys  5 minutes  Flexion   UE ranger (standing)  2 minutes  Flexion  Resisted row  Green t-band x 15 reps    Resisted pull down  Green t-band x 20 reps    Bicep curl 2# x 2.5 minutes  With forearms supinated  Seated cane punch out 1 x 6 reps followed by 1 x 10 reps    Seated cane flexion  2 x 10 reps    Therabar bending  Red t-bar x 15 reps each Up and down   Therabar twisting  Red t-bar x 2.5 minutes    AA cane ER  3 minutes   Isometric ball squeeze 3.5 minutes w/ 5 second  For shoulder IR   Wall ladder  5 reps  Max #20  R shoulder ABD isometric  3 minutes w/ 5 second hold    Blank cell = exercise not performed today  Modalities: no adverse reaction to  today's modalities  Date:  Vaso: Shoulder, 34 degrees; low pressure, 15 mins, soreness                                   08/20/23 EXERCISE LOG  Exercise Repetitions and Resistance Comments  Pulleys  4 minutes   Manual therapy See below    UE ranger (seated) 2.5 minutes            Blank cell = exercise not performed today  Manual Therapy Soft Tissue Mobilization: scar mobilization, deltoid, biceps, and pectoralis minor, for reduced pain, tone and improved soft tissue extensibility   Modalities  Date:  Vaso: Shoulder, 34 degrees; low pressure, 15 mins, Pain and Tone                                   08/18/23 EXERCISE LOG  Exercise Repetitions and Resistance Comments  Pulleys  5 minutes  For passive R shoulder flexion  Ranger Flex/ext; CW and CCW circles x 2 mins each   Scapular retraction  25 reps    Bicep curl 3-way 1# x 20 reps each RUE only   Theraputty squeeze   RUE only   Thera-bar Up/Down Red x 20 reps  each    Blank cell = exercise not performed today   Manual Therapy Soft Tissue Mobilization: right shoulder, STW/M to right deltoid for reduced pain and tone   Modalities: no adverse reaction to today's modalities  Date:  Vaso: Shoulder, 34 degrees; low pressure, 15 mins, Pain  PATIENT EDUCATION: Education details: healing and expectation for soreness Person educated: Patient Education method: Explanation Education comprehension: verbalized understanding  HOME EXERCISE PROGRAM: [id number]  ASSESSMENT:  CLINICAL IMPRESSION: Patient was progressed with multiple new interventions for improved deltoid engagement right shoulder mobility. She required minimal cueing with today's new interventions for proper exercise performance to avoid compensatory movement patterns. She exhibited a mild increase in right upper extremity fatigue as evidenced by her reduced arc of motion. However, she experienced no increase in pain or discomfort with any of today's interventions. She reported that her shoulder felt good upon the conclusion of treatment. She continues to require skilled physical therapy to address her remaining impairments to return to her prior level of function.    OBJECTIVE IMPAIRMENTS: decreased activity tolerance, decreased ROM, decreased strength, hypomobility, impaired sensation, impaired tone, impaired UE functional use, and pain.   ACTIVITY LIMITATIONS: carrying, lifting, sleeping, bed mobility, dressing, and reach over head  PARTICIPATION LIMITATIONS: meal prep, cleaning, laundry, shopping, and community activity  PERSONAL FACTORS: 3+ comorbidities: Anxiety, depression, glaucoma, and osteopenia  are also affecting patient's functional outcome.   REHAB POTENTIAL: Good  CLINICAL DECISION MAKING: Evolving/moderate complexity  EVALUATION COMPLEXITY: Moderate   GOALS: Goals reviewed with patient? Yes  SHORT TERM GOALS: Target date: 09/01/23  Patient will be independent with  her initial HEP.  Baseline: Goal status: INITIAL  2.  Patient will improve her Quick DASH score to 55% disability or less for improved function with her daily activities.  Baseline:  Goal status: INITIAL  3.  Patient will be able to demonstrate at least 90 degrees of right shoulder flexion for improved function painting.  Baseline:  Goal status: INITIAL  LONG TERM GOALS: Target date: 09/22/23  Patient will be independent with her advanced HEP.  Baseline:  Goal status: INITIAL  2.  Patient will improve her Quick DASH score to 45% disability or less for improved function with her daily activities.  Baseline:  Goal status: INITIAL  3.  Patient will be able to demonstrate at least 120 degrees of active right shoulder flexion for improved function reaching overhead.  Baseline:  Goal status: INITIAL  4.  Patient will be able to carry at least 5 pounds for improved function carrying her pocketbook.  Baseline:  Goal status: INITIAL  PLAN:  PT FREQUENCY: 2x/week  PT DURATION: 6 weeks  PLANNED INTERVENTIONS: 97164- PT Re-evaluation, 97110-Therapeutic exercises, 97530- Therapeutic activity, 97112- Neuromuscular re-education, 97535- Self Care, [id number]- Manual therapy, G0283- Electrical stimulation (unattended), 97016- Vasopneumatic device, Patient/Family education, Joint mobilization, Cryotherapy, and Moist heat  PLAN FOR NEXT SESSION: PROM, AAROM, isometrics, manual therapy, and modalities as needed   Lane Pinon, PT 08/25/2023, 11:36 AM

## 2023-08-27 ENCOUNTER — Encounter

## 2023-08-29 ENCOUNTER — Ambulatory Visit

## 2023-08-29 DIAGNOSIS — M25511 Pain in right shoulder: Secondary | ICD-10-CM

## 2023-08-29 DIAGNOSIS — M25611 Stiffness of right shoulder, not elsewhere classified: Secondary | ICD-10-CM

## 2023-08-29 DIAGNOSIS — M6281 Muscle weakness (generalized): Secondary | ICD-10-CM

## 2023-08-29 NOTE — Therapy (Signed)
 OUTPATIENT PHYSICAL THERAPY SHOULDER TREATMENT   Patient Name: Veronica Rojas  Veronica Rojas MRN: [id number] DOB:[birth year], 78 y.o., female Today's Date: 08/29/2023  END OF SESSION:  PT End of Session - 08/29/23 0848     Visit Number 6    Number of Visits 12    Date for PT Re-Evaluation 10/31/23    PT Start Time 0845    PT Stop Time 0947    PT Time Calculation (min) 62 min    Activity Tolerance Patient tolerated treatment well    Behavior During Therapy [facility] for tasks assessed/performed                Past Medical History:  Diagnosis Date   Anxiety    Depression    GERD (gastroesophageal reflux disease)    Glaucoma    left eye got bacterial infection   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   Hypercholesteremia    Macular hole of both eyes    Past Surgical History:  Procedure Laterality Date   ABDOMINAL HYSTERECTOMY     CHOLECYSTECTOMY     EYE SURGERY     pt reports 10 eye surgeries   KNEE ARTHROSCOPY Right 03/04/2017   Procedure: ARTHROSCOPY KNEE;  Surgeon: Dayne Even, MD;  Location: [facility] OR;  Service: Orthopedics;  Laterality: Right;   PARS PLANA VITRECTOMY Left 04/12/2015   Procedure: PARS PLANA VITRECTOMY 25 GAUGE FOR ENDOPHTHALMITIS/VITREOUS TAP/INJECTION OF ANTIBOTICS, REVISION OF OPERATIVE WOUND, CONJUNCTIVA FLAP;  Surgeon: Shon Downing, MD;  Location: [facility] OR;  Service: Ophthalmology;  Laterality: Left;   TUBAL LIGATION     There are no active problems to display for this patient.  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    Hand dominance: Right  PERTINENT HISTORY: Anxiety, depression, glaucoma, and osteopenia  PAIN:  Are you having pain? Yes: NPRS scale: 3-4 Pain location: right shoulder and upper arm Pain description: constant dull, and soreness Aggravating factors: using her arm Relieving factors: ice and medication  PRECAUTIONS: Shoulder  RED FLAGS: None   WEIGHT BEARING RESTRICTIONS: Yes ; no pressure on her right  arm yet  FALLS:  Has patient fallen in last 6 months? No  LIVING ENVIRONMENT: Lives with: lives with their family Lives in: House/apartment Has following equipment at home: None  OCCUPATION: Retired  PLOF: Independent  PATIENT GOALS: be able color, paint, and do her puzzles  NEXT MD VISIT: 09/05/23  OBJECTIVE:  Note: Objective measures were completed at Evaluation unless otherwise noted.  PATIENT SURVEYS:  Quick Dash 65.9% disability  COGNITION: Overall cognitive status: Within functional limits for tasks assessed     SENSATION: Patient reports tingling in her right hand since her surgery  UPPER EXTREMITY ROM:   Active ROM Right eval Right 08/20/22 Left eval  Shoulder flexion 15 PROM: 90 82 137  Shoulder extension     Shoulder abduction 37 PROM: 50 70 123  Shoulder adduction     Shoulder internal rotation     Shoulder external rotation PROM: 15 degrees at neutral    Elbow flexion     Elbow extension     Wrist flexion     Wrist extension     Wrist ulnar deviation     Wrist radial deviation     Wrist pronation     Wrist supination     (Blank rows = not tested)  UPPER EXTREMITY MMT: not tested due to surgical condition   JOINT MOBILITY TESTING:  R shoulder: hypomobile and painful  PALPATION:  TTP: right upper trapezius, supraspinatus, infraspinatus, surgical incision, triceps, and biceps                                                                                                                              TREATMENT DATE:                                    08/25/23 EXERCISE LOG  Exercise Repetitions and Resistance Comments  UBE 6 mins 120 rpm (forward/backward)   Pulleys  5 minutes  Flexion   UE ranger (seated)  2 minutes each way Flexion  Resisted row  Green t-band x 20 reps    Resisted pull down  Green t-band x 25 reps    Bicep curl 2# x 3 minutes  With forearms supinated  Seated cane punch out    Seated cane flexion  2 x 10 reps    Therabar bending  Red t-bar x 20 reps each Up and down   Therabar twisting  Red t-bar x 2.5 minutes    AA cane ER     Isometric ball squeeze 3.5 minutes w/ 5 second  For shoulder IR   Wall ladder   Max #20  R shoulder ABD isometric      Blank cell = exercise not performed today  Modalities: no adverse reaction to today's modalities  Date:  Unattended Estim: Shoulder, IFC 80-150 Hz, 15 mins, Pain Vaso: Shoulder, 34 degrees; low pressure, 15 mins, Pain  08/20/23 EXERCISE LOG  Exercise Repetitions and Resistance Comments  Pulleys  4 minutes   Manual therapy See below    UE ranger (seated) 2.5 minutes            Blank cell = exercise not performed today  Manual Therapy Soft Tissue Mobilization: scar mobilization, deltoid, biceps, and pectoralis minor, for reduced pain, tone and improved soft tissue extensibility   Modalities  Date:  Vaso: Shoulder, 34 degrees; low pressure, 15 mins, Pain and Tone                                   08/18/23 EXERCISE LOG  Exercise Repetitions and Resistance Comments  Pulleys  5 minutes  For passive R shoulder flexion  Ranger Flex/ext; CW and CCW circles x 2 mins each   Scapular retraction  25 reps    Bicep curl 3-way 1# x 20 reps each RUE only   Theraputty squeeze   RUE only   Thera-bar Up/Down Red x 20 reps each    Blank cell = exercise not performed today   Manual  Therapy Soft Tissue Mobilization: right shoulder, STW/M to right deltoid for reduced pain and tone   Modalities: no adverse reaction to today's modalities  Date:  Vaso: Shoulder, 34 degrees; low pressure, 15 mins, Pain  PATIENT EDUCATION: Education details: healing and expectation for soreness Person educated: Patient Education method: Explanation Education comprehension: verbalized understanding  HOME EXERCISE PROGRAM: [id number]  ASSESSMENT:  CLINICAL IMPRESSION: Pt arrives for today's treatment session reporting 3-4/10 right shoulder pain.  Pt reports she was "very sore" after last treatment session. Number of exercises decreased today due to increased soreness after last treatment session.  Pt able to demonstrate 92 degrees of right shoulder flexion today, meeting her STG.  Pt also able to decrease QuickDash socre to 52.3 %, meeting her STG for that as well.   Pt able to tolerate increased reps in all exercises performed today. Normal responses to estim and vaso noted upon removal.  Pt reported decreased pain at completion of today's treatment session.   OBJECTIVE IMPAIRMENTS: decreased activity tolerance, decreased ROM, decreased strength, hypomobility, impaired sensation, impaired tone, impaired UE functional use, and pain.   ACTIVITY LIMITATIONS: carrying, lifting, sleeping, bed mobility, dressing, and reach over head  PARTICIPATION LIMITATIONS: meal prep, cleaning, laundry, shopping, and community activity  PERSONAL FACTORS: 3+ comorbidities: Anxiety, depression, glaucoma, and osteopenia  are also affecting patient's functional outcome.   REHAB POTENTIAL: Good  CLINICAL DECISION MAKING: Evolving/moderate complexity  EVALUATION COMPLEXITY: Moderate   GOALS: Goals reviewed with patient? Yes  SHORT TERM GOALS: Target date: 09/01/23  Patient will be independent with her initial HEP.  Baseline: Goal status: IN PROGRESS  2.  Patient will improve her Quick DASH score to 55%  disability or less for improved function with her daily activities.  Baseline: 4/25: 52.3% Goal status: MET  3.  Patient will be able to demonstrate at least 90 degrees of right shoulder flexion for improved function painting.  Baseline:  Goal status: INITIAL  LONG TERM GOALS: Target date: 09/22/23  Patient will be independent with her advanced HEP.  Baseline:  Goal status: IN PROGRESS  2.  Patient will improve her Quick DASH score to 45% disability or less for improved function with her daily activities.  Baseline:  Goal status: IN PROGRESS  3.  Patient will be able to demonstrate at least 120 degrees of  active right shoulder flexion for improved function reaching overhead.  Baseline:  Goal status: IN PROGRESS  4.  Patient will be able to carry at least 5 pounds for improved function carrying her pocketbook.  Baseline:  Goal status: IN PROGRESS  PLAN:  PT FREQUENCY: 2x/week  PT DURATION: 6 weeks  PLANNED INTERVENTIONS: 97164- PT Re-evaluation, 97110-Therapeutic exercises, 97530- Therapeutic activity, 97112- Neuromuscular re-education, 97535- Self Care, 16109- Manual therapy, [id number]- Electrical stimulation (unattended), 97016- Vasopneumatic device, Patient/Family education, Joint mobilization, Cryotherapy, and Moist heat  PLAN FOR NEXT SESSION: PROM, AAROM, isometrics, manual therapy, and modalities as needed   Deryl Flora, PTA 08/29/2023, 9:51 AM

## 2023-09-01 ENCOUNTER — Ambulatory Visit

## 2023-09-01 DIAGNOSIS — M6281 Muscle weakness (generalized): Secondary | ICD-10-CM

## 2023-09-01 DIAGNOSIS — M25511 Pain in right shoulder: Secondary | ICD-10-CM | POA: Diagnosis not present

## 2023-09-01 DIAGNOSIS — M25611 Stiffness of right shoulder, not elsewhere classified: Secondary | ICD-10-CM

## 2023-09-01 NOTE — Therapy (Signed)
 OUTPATIENT PHYSICAL THERAPY SHOULDER TREATMENT   Patient Name: Veronica Rojas MRN: [id number] DOB:Jul 09, 1945, 78 y.o., female Today's Date: 09/01/2023  END OF SESSION:  PT End of Session - 09/01/23 0932     Visit Number 7    Number of Visits 12    Date for PT Re-Evaluation 10/31/23    PT Start Time 0930    PT Stop Time 1031    PT Time Calculation (min) 61 min    Activity Tolerance Patient tolerated treatment well    Behavior During Therapy [facility]FL for tasks assessed/performed                Past Medical History:  Diagnosis Date   Anxiety    Depression    GERD (gastroesophageal reflux disease)    Glaucoma    left eye got bacterial infection   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   Hypercholesteremia    Macular hole of both eyes    Past Surgical History:  Procedure Laterality Date   ABDOMINAL HYSTERECTOMY     CHOLECYSTECTOMY     EYE SURGERY     pt reports 10 eye surgeries   KNEE ARTHROSCOPY Right 03/04/2017   Procedure: ARTHROSCOPY KNEE;  Surgeon: Dayne Even, MD;  Location: [facility] OR;  Service: Orthopedics;  Laterality: Right;   PARS PLANA VITRECTOMY Left 04/12/2015   Procedure: PARS PLANA VITRECTOMY 25 GAUGE FOR ENDOPHTHALMITIS/VITREOUS TAP/INJECTION OF ANTIBOTICS, REVISION OF OPERATIVE WOUND, CONJUNCTIVA FLAP;  Surgeon: Shon Downing, MD;  Location: [facility] OR;  Service: Ophthalmology;  Laterality: Left;   TUBAL LIGATION     There are no active problems to display for this patient.  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    Hand dominance: Right  PERTINENT HISTORY: Anxiety, depression, glaucoma, and osteopenia  PAIN:  Are you having pain? No  PRECAUTIONS: Shoulder  RED FLAGS: None   WEIGHT BEARING RESTRICTIONS: Yes ; no pressure on her right arm yet  FALLS:  Has patient fallen in last 6 months? No  LIVING ENVIRONMENT: Lives with: lives with their family Lives in: House/apartment Has following equipment at home: None  OCCUPATION: Retired  PLOF: Independent  PATIENT GOALS: be able color, paint, and do her puzzles  NEXT MD VISIT: 09/05/23  OBJECTIVE:  Note: Objective measures were completed at Evaluation unless otherwise noted.  PATIENT SURVEYS:  Quick Dash 65.9% disability  COGNITION: Overall cognitive status: Within functional limits for tasks assessed     SENSATION: Patient reports tingling in her right hand since her surgery  UPPER EXTREMITY ROM:   Active ROM Right eval Right 08/20/22 Left eval  Shoulder flexion 15 PROM: 90 82 137  Shoulder extension     Shoulder abduction 37 PROM: 50 70 123  Shoulder adduction     Shoulder internal rotation     Shoulder external rotation PROM: 15 degrees at neutral    Elbow flexion     Elbow extension     Wrist flexion     Wrist extension     Wrist ulnar deviation     Wrist radial deviation     Wrist pronation     Wrist supination     (Blank rows = not tested)  UPPER EXTREMITY MMT: not tested due to surgical condition   JOINT MOBILITY TESTING:  R shoulder: hypomobile and painful  PALPATION:  TTP: right upper trapezius, supraspinatus, infraspinatus, surgical incision, triceps, and biceps                                                                                                                             TREATMENT DATE:    09/01/23    EXERCISE LOG  Exercise Repetitions and Resistance Comments  UBE 8 mins 120 rpm (forward/backward)   Pulleys  5 minutes  Flexion   UE ranger (seated)  2.5 minutes each way Flexion  Resisted row  Green t-band x 25 reps    Resisted pull down  Green t-band x 25 reps    Bicep curl 3-way 3# x 20 reps each way With forearms supinated  Seated cane punch out    Seated cane flexion  2 x 10 reps    Therabar bending  Red t-bar x 20 reps each Up and down   Therabar twisting  Red t-bar x 2.5 minutes    AA cane ER     Isometric ball squeeze 3.5 minutes w/ 5 second  For shoulder IR   Wall ladder   Max #20  R shoulder ABD isometric      Blank cell = exercise not performed today  Modalities: no adverse reaction to today's modalities  Date:  Unattended Estim: Shoulder, IFC 80-150 Hz, 15 mins, Pain Vaso: Shoulder, 34 degrees; low pressure, 15 mins, Pain                                   08/20/23 EXERCISE LOG  Exercise Repetitions and Resistance Comments  Pulleys  4 minutes   Manual therapy See below    UE ranger (seated) 2.5 minutes            Blank cell = exercise not performed today  Manual Therapy Soft Tissue Mobilization: scar mobilization, deltoid, biceps, and pectoralis minor, for reduced pain, tone and improved soft tissue extensibility   Modalities  Date:  Vaso: Shoulder, 34 degrees; low pressure, 15 mins, Pain and Tone                                   08/18/23 EXERCISE LOG  Exercise Repetitions and Resistance Comments  Pulleys  5 minutes  For passive R shoulder flexion  Ranger Flex/ext; CW and CCW circles x 2 mins each   Scapular retraction  25 reps    Bicep curl 3-way 1# x 20 reps each RUE only   Theraputty squeeze   RUE only   Thera-bar Up/Down Red x 20 reps each    Blank cell = exercise not performed today   Manual Therapy Soft Tissue Mobilization: right shoulder, STW/M to right deltoid for reduced pain and tone   Modalities: no adverse reaction to today's  modalities  Date:  Vaso: Shoulder, 34 degrees; low pressure, 15 mins, Pain  PATIENT EDUCATION: Education details: healing and expectation for soreness Person educated: Patient Education method: Explanation Education comprehension: verbalized understanding  HOME EXERCISE PROGRAM: [id number]  ASSESSMENT:  CLINICAL IMPRESSION: Pt arrives for today's treatment session denying any shoulder pain while at rest, but reports that pain can get up to 3/10 with movement.  Pt able tolerate increased reps or time with all previously performed exercises.  Normal responses to estim and vaso noted upon removal.  Pt denied any pain at completion of today's treatment session.   OBJECTIVE IMPAIRMENTS: decreased activity tolerance, decreased ROM, decreased strength, hypomobility, impaired sensation, impaired tone, impaired UE functional use, and pain.   ACTIVITY LIMITATIONS: carrying, lifting, sleeping, bed mobility, dressing, and reach over head  PARTICIPATION LIMITATIONS: meal prep, cleaning, laundry, shopping, and community activity  PERSONAL FACTORS: 3+ comorbidities: Anxiety, depression, glaucoma, and osteopenia  are also affecting patient's functional outcome.   REHAB POTENTIAL: Good  CLINICAL DECISION MAKING: Evolving/moderate complexity  EVALUATION COMPLEXITY: Moderate   GOALS: Goals reviewed with patient? Yes  SHORT TERM GOALS: Target date: 09/01/23  Patient will be independent with her initial HEP.  Baseline: Goal status: IN PROGRESS  2.  Patient will improve her Quick DASH score to 55% disability or less for improved function with her daily activities.  Baseline: 4/25: 52.3% Goal status: MET  3.  Patient will be able to demonstrate at least 90 degrees of right shoulder flexion for improved function painting.  Baseline: 4/25: 92 degrees  Goal status:  MET  LONG TERM GOALS: Target date: 09/22/23  Patient will be independent with her advanced HEP.  Baseline:  Goal status: IN  PROGRESS  2.  Patient will improve her Quick DASH score to 45% disability or less for improved function with her daily activities.  Baseline:  Goal status: IN PROGRESS  3.  Patient will be able to demonstrate at least 120 degrees of active right shoulder flexion for improved function reaching overhead.  Baseline:  Goal status: IN PROGRESS  4.  Patient will be able to carry at least 5 pounds for improved function carrying her pocketbook.  Baseline:  Goal status: IN PROGRESS  PLAN:  PT FREQUENCY: 2x/week  PT DURATION: 6 weeks  PLANNED INTERVENTIONS: 97164- PT Re-evaluation, 97110-Therapeutic exercises, 97530- Therapeutic activity, 97112- Neuromuscular re-education, 97535- Self Care, 16109- Manual therapy, [id number]- Electrical stimulation (unattended), 97016- Vasopneumatic device, Patient/Family education, Joint mobilization, Cryotherapy, and Moist heat  PLAN FOR NEXT SESSION: PROM, AAROM, isometrics, manual therapy, and modalities as needed   Deryl Flora, PTA 09/01/2023, 10:41 AM

## 2023-09-04 ENCOUNTER — Ambulatory Visit: Attending: Orthopedic Surgery

## 2023-09-04 DIAGNOSIS — M25611 Stiffness of right shoulder, not elsewhere classified: Secondary | ICD-10-CM | POA: Diagnosis present

## 2023-09-04 DIAGNOSIS — M6281 Muscle weakness (generalized): Secondary | ICD-10-CM | POA: Insufficient documentation

## 2023-09-04 DIAGNOSIS — M25511 Pain in right shoulder: Secondary | ICD-10-CM | POA: Diagnosis present

## 2023-09-04 NOTE — Therapy (Signed)
 OUTPATIENT PHYSICAL THERAPY SHOULDER TREATMENT   Patient Name: Veronica Rojas  Nyema Brinck MRN: [id number] DOB:26-Jan-1946, 78 y.o., female Today's Date: 09/04/2023  END OF SESSION:  PT End of Session - 09/04/23 1032     Visit Number 8    Number of Visits 12    Date for PT Re-Evaluation 10/31/23    PT Start Time 1018    PT Stop Time 1112    PT Time Calculation (min) 54 min    Activity Tolerance Patient tolerated treatment well    Behavior During Therapy WFL for tasks assessed/performed                Past Medical History:  Diagnosis Date   Anxiety    Depression    GERD (gastroesophageal reflux disease)    Glaucoma    left eye got bacterial infection   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   Hypercholesteremia    Macular hole of both eyes    Past Surgical History:  Procedure Laterality Date   ABDOMINAL HYSTERECTOMY     CHOLECYSTECTOMY     EYE SURGERY     pt reports 10 eye surgeries   KNEE ARTHROSCOPY Right 03/04/2017   Procedure: ARTHROSCOPY KNEE;  Surgeon: Dayne Even, MD;  Location: [facility] OR;  Service: Orthopedics;  Laterality: Right;   PARS PLANA VITRECTOMY Left 04/12/2015   Procedure: PARS PLANA VITRECTOMY 25 GAUGE FOR ENDOPHTHALMITIS/VITREOUS TAP/INJECTION OF ANTIBOTICS, REVISION OF OPERATIVE WOUND, CONJUNCTIVA FLAP;  Surgeon: Shon Downing, MD;  Location: [facility] OR;  Service: Ophthalmology;  Laterality: Left;   TUBAL LIGATION     There are no active problems to display for this patient.  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   Hand dominance: Right  PERTINENT HISTORY: Anxiety, depression, glaucoma, and osteopenia  PAIN:  Are you having pain? No  PRECAUTIONS: Shoulder  RED FLAGS: None   WEIGHT BEARING RESTRICTIONS: Yes ; no pressure on her right arm yet  FALLS:  Has patient fallen in last 6 months? No  LIVING ENVIRONMENT: Lives with: lives with their family Lives in: House/apartment Has  following equipment at home: None  OCCUPATION: Retired  PLOF: Independent  PATIENT GOALS: be able color, paint, and do her puzzles  NEXT MD VISIT: 09/05/23  OBJECTIVE:  Note: Objective measures were completed at Evaluation unless otherwise noted.  PATIENT SURVEYS:  Quick Dash 65.9% disability  COGNITION: Overall cognitive status: Within functional limits for tasks assessed     SENSATION: Patient reports tingling in her right hand since her surgery  UPPER EXTREMITY ROM:   Active ROM Right eval Right 08/20/22 Left eval  Shoulder flexion 15 PROM: 90 82 137  Shoulder extension     Shoulder abduction 37 PROM: 50 70 123  Shoulder adduction     Shoulder internal rotation     Shoulder external rotation PROM: 15 degrees at neutral    Elbow flexion     Elbow extension     Wrist flexion     Wrist extension     Wrist ulnar deviation     Wrist radial deviation     Wrist pronation     Wrist supination     (Blank rows = not tested)  UPPER EXTREMITY MMT: not tested due to surgical condition   JOINT MOBILITY TESTING:  R shoulder: hypomobile and painful  PALPATION:  TTP: right upper trapezius, supraspinatus, infraspinatus, surgical incision, triceps, and biceps                                                                                                                             TREATMENT DATE:   09/04/23    EXERCISE LOG  Exercise Repetitions and  Resistance Comments  UBE 10 mins 120 rpm (forward/backward)   Pulleys  6 minutes  Flexion   UE ranger (standing)  3 mins Flexion  Resisted row  Green t-band x 25 reps    Resisted pull down  Green t-band x 25 reps    Bicep curl 3-way 3# x 25 reps each way bil With forearms supinated  Seated cane punch out    Seated cane flexion  2 x 10 reps    Therabar bending  Red t-bar x 20 reps each Up and down   Therabar twisting  Red t-bar x 2.5 minutes    AA cane ER     Isometric ball squeeze 3.5 minutes w/ 5 second  For shoulder IR   Wall ladder   Max #20  R shoulder ABD isometric      Blank cell = exercise not performed today  Modalities: no adverse reaction to today's modalities  Date:  Unattended Estim: Shoulder, IFC 80-150 Hz, 15 mins, Pain Vaso: Shoulder, 34 degrees; low pressure, 15 mins, Pain                                   08/20/23 EXERCISE LOG  Exercise Repetitions and Resistance Comments  Pulleys  4 minutes   Manual therapy See below  UE ranger (seated) 2.5 minutes            Blank cell = exercise not performed today  Manual Therapy Soft Tissue Mobilization: scar mobilization, deltoid, biceps, and pectoralis minor, for reduced pain, tone and improved soft tissue extensibility   Modalities  Date:  Vaso: Shoulder, 34 degrees; low pressure, 15 mins, Pain and Tone                                   08/18/23 EXERCISE LOG  Exercise Repetitions and Resistance Comments  Pulleys  5 minutes  For passive R shoulder flexion  Ranger Flex/ext; CW and CCW circles x 2 mins each   Scapular retraction  25 reps    Bicep curl 3-way 1# x 20 reps each RUE only   Theraputty squeeze   RUE only   Thera-bar Up/Down Red x 20 reps each    Blank cell = exercise not performed today   Manual Therapy Soft Tissue Mobilization: right shoulder, STW/M to right deltoid for reduced pain and tone   Modalities: no adverse reaction to today's modalities  Date:  Vaso: Shoulder, 34 degrees; low pressure,  15 mins, Pain  PATIENT EDUCATION: Education details: healing and expectation for soreness Person educated: Patient Education method: Explanation Education comprehension: verbalized understanding  HOME EXERCISE PROGRAM: [id number]  ASSESSMENT:  CLINICAL IMPRESSION: Pt arrives for today's treatment session reporting 2/10 right shoulder pain.  Pt able to tolerate increased reps with thera-band exercises today without issue.  Pt also able to tolerate standing UE ranger today with good results.  Pt reports performing more exercises and using her RUE more when at home.  Normal responses to estim and vaso noted upon removal.  Pt denied any pain at completion of today's treatment session.   OBJECTIVE IMPAIRMENTS: decreased activity tolerance, decreased ROM, decreased strength, hypomobility, impaired sensation, impaired tone, impaired UE functional use, and pain.   ACTIVITY LIMITATIONS: carrying, lifting, sleeping, bed mobility, dressing, and reach over head  PARTICIPATION LIMITATIONS: meal prep, cleaning, laundry, shopping, and community activity  PERSONAL FACTORS: 3+ comorbidities: Anxiety, depression, glaucoma, and osteopenia  are also affecting patient's functional outcome.   REHAB POTENTIAL: Good  CLINICAL DECISION MAKING: Evolving/moderate complexity  EVALUATION COMPLEXITY: Moderate   GOALS: Goals reviewed with patient? Yes  SHORT TERM GOALS: Target date: 09/01/23  Patient will be independent with her initial HEP.  Baseline: Goal status: IN PROGRESS  2.  Patient will improve her Quick DASH score to 55% disability or less for improved function with her daily activities.  Baseline: 4/25: 52.3% Goal status: MET  3.  Patient will be able to demonstrate at least 90 degrees of right shoulder flexion for improved function painting.  Baseline: 4/25: 92 degrees  Goal status:  MET  LONG TERM GOALS: Target date: 09/22/23  Patient will be independent with her advanced HEP.  Baseline:   Goal status: IN PROGRESS  2.  Patient will improve her Quick DASH score to 45% disability or less for improved function with her daily activities.  Baseline:  Goal status: IN PROGRESS  3.  Patient will be able to demonstrate at least 120 degrees of active right shoulder flexion for improved function reaching overhead.  Baseline:  Goal status: IN PROGRESS  4.  Patient will be able to carry at least 5 pounds for improved function carrying her pocketbook.  Baseline:  Goal status: IN PROGRESS  PLAN:  PT FREQUENCY: 2x/week  PT DURATION: 6 weeks  PLANNED INTERVENTIONS: 97164- PT Re-evaluation, 97110-Therapeutic exercises, 97530- Therapeutic activity, 97112- Neuromuscular re-education, 97535- Self Care, 40981- Manual therapy, [id number]- Electrical stimulation (unattended), 97016- Vasopneumatic device, Patient/Family education, Joint mobilization, Cryotherapy, and Moist heat  PLAN FOR NEXT SESSION: PROM, AAROM, isometrics, manual therapy, and modalities as needed   Deryl Flora, PTA 09/04/2023, 11:14 AM

## 2023-09-09 ENCOUNTER — Ambulatory Visit

## 2023-09-09 DIAGNOSIS — M6281 Muscle weakness (generalized): Secondary | ICD-10-CM

## 2023-09-09 DIAGNOSIS — M25611 Stiffness of right shoulder, not elsewhere classified: Secondary | ICD-10-CM

## 2023-09-09 DIAGNOSIS — M25511 Pain in right shoulder: Secondary | ICD-10-CM | POA: Diagnosis not present

## 2023-09-09 NOTE — Therapy (Signed)
 OUTPATIENT PHYSICAL THERAPY SHOULDER TREATMENT   Patient Name: Veronica Rojas  Marlana Ratledge MRN: [id number] DOB:[birth year], 78 y.o., female Today's Date: 09/09/2023  END OF SESSION:  PT End of Session - 09/09/23 1022     Visit Number 9    Number of Visits 12    Date for PT Re-Evaluation 10/31/23    PT Start Time 1015    PT Stop Time 1113    PT Time Calculation (min) 58 min    Activity Tolerance Patient tolerated treatment well    Behavior During Therapy WFL for tasks assessed/performed                Past Medical History:  Diagnosis Date   Anxiety    Depression    GERD (gastroesophageal reflux disease)    Glaucoma    left eye got bacterial infection   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   Hypercholesteremia    Macular hole of both eyes    Past Surgical History:  Procedure Laterality Date   ABDOMINAL HYSTERECTOMY     CHOLECYSTECTOMY     EYE SURGERY     pt reports 10 eye surgeries   KNEE ARTHROSCOPY Right 03/04/2017   Procedure: ARTHROSCOPY KNEE;  Surgeon: Dayne Even, MD;  Location: [facility] OR;  Service: Orthopedics;  Laterality: Right;   PARS PLANA VITRECTOMY Left 04/12/2015   Procedure: PARS PLANA VITRECTOMY 25 GAUGE FOR ENDOPHTHALMITIS/VITREOUS TAP/INJECTION OF ANTIBOTICS, REVISION OF OPERATIVE WOUND, CONJUNCTIVA FLAP;  Surgeon: Shon Downing, MD;  Location: [facility] OR;  Service: Ophthalmology;  Laterality: Left;   TUBAL LIGATION     There are no active problems to display for this patient.  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   Hand dominance: Right  PERTINENT HISTORY: Anxiety, depression, glaucoma, and osteopenia  PAIN:  Are you having pain? Yes: NPRS scale: 2-3/10 Pain location: right shoulder  PRECAUTIONS: Shoulder  RED FLAGS: None   WEIGHT BEARING RESTRICTIONS: Yes ; no pressure on her right arm yet  FALLS:  Has patient fallen in last 6 months? No  LIVING ENVIRONMENT: Lives with: lives  with their family Lives in: House/apartment Has following equipment at home: None  OCCUPATION: Retired  PLOF: Independent  PATIENT GOALS: be able color, paint, and do her puzzles  NEXT MD VISIT: 09/05/23  OBJECTIVE:  Note: Objective measures were completed at Evaluation unless otherwise noted.  PATIENT SURVEYS:  Quick Dash 65.9% disability  COGNITION: Overall cognitive status: Within functional limits for tasks assessed     SENSATION: Patient reports tingling in her right hand since her surgery  UPPER EXTREMITY ROM:   Active ROM Right eval Right 08/20/22 Left eval  Shoulder flexion 15 PROM: 90 82 137  Shoulder extension     Shoulder abduction 37 PROM: 50 70 123  Shoulder adduction     Shoulder internal rotation     Shoulder external rotation PROM: 15 degrees at neutral    Elbow flexion     Elbow extension     Wrist flexion     Wrist extension     Wrist ulnar deviation     Wrist radial deviation     Wrist pronation     Wrist supination     (Blank rows = not tested)  UPPER EXTREMITY MMT: not tested due to surgical condition   JOINT MOBILITY TESTING:  R shoulder: hypomobile and painful  PALPATION:  TTP: right upper trapezius, supraspinatus, infraspinatus, surgical incision, triceps, and biceps                                                                                                                             TREATMENT DATE:    09/09/23    EXERCISE LOG  Exercise Repetitions and Resistance Comments  UBE 10 mins 90 rpm (forward/backward)   Pulleys  6 minutes  Flexion   UE ranger (standing)  3 mins Flexion  Resisted row  Green t-band x 30 reps    Resisted pull down  Green t-band x 30 reps    Bicep curl 3-way 3# x 25 reps each way bil With forearms supinated  Seated cane punch out    Seated cane flexion     Therabar bending  Red t-bar x 25 reps each Up and down   Therabar twisting  Red t-bar x 3 minutes    AA cane ER     Isometric ball squeeze 4 mins For shoulder IR   Wall ladder   Max #20  R shoulder ABD isometric      Blank cell = exercise not performed today  Modalities: no adverse reaction to today's modalities  Date:  Unattended Estim: Shoulder, IFC 80-150 Hz, 15 mins, Pain Vaso: Shoulder, 34 degrees; low pressure, 15 mins, Pain                                   08/20/23 EXERCISE LOG  Exercise Repetitions and Resistance Comments  Pulleys  4 minutes   Manual therapy See below  UE ranger (seated) 2.5 minutes            Blank cell = exercise not performed today  Manual Therapy Soft Tissue Mobilization: scar mobilization, deltoid, biceps, and pectoralis minor, for reduced pain, tone and improved soft tissue extensibility   Modalities  Date:  Vaso: Shoulder, 34 degrees; low pressure, 15 mins, Pain and Tone                                   08/18/23 EXERCISE LOG  Exercise Repetitions and Resistance Comments  Pulleys  5 minutes  For passive R shoulder flexion  Ranger Flex/ext; CW and CCW circles x 2 mins each   Scapular retraction  25 reps    Bicep curl 3-way 1# x 20 reps each RUE only   Theraputty squeeze   RUE only   Thera-bar Up/Down Red x 20 reps each    Blank cell = exercise not performed today   Manual Therapy Soft Tissue Mobilization: right shoulder, STW/M to right deltoid for reduced pain and tone   Modalities: no adverse reaction to today's modalities  Date:  Vaso: Shoulder, 34  degrees; low pressure, 15 mins, Pain  PATIENT EDUCATION: Education details: healing and expectation for soreness Person educated: Patient Education method: Explanation Education comprehension: verbalized understanding  HOME EXERCISE PROGRAM: [id number]  ASSESSMENT:  CLINICAL IMPRESSION: Pt arrives for today's treatment session reporting 2-3/10 right shoulder pain.  Pt suspects that she slept on her shoulder last night which is causing increased soreness.  Pt able to tolerate increased time and reps with all previously performed exercises.  Pt will need progress note at next treatment session.  Normal responses to estim and vaso noted upon removal.  Pt denied any pain at completion of today's treatment session.  OBJECTIVE IMPAIRMENTS: decreased activity tolerance, decreased ROM, decreased strength, hypomobility, impaired sensation, impaired tone, impaired UE functional use, and pain.   ACTIVITY LIMITATIONS: carrying, lifting, sleeping, bed mobility, dressing, and reach over head  PARTICIPATION LIMITATIONS: meal prep, cleaning, laundry, shopping, and community activity  PERSONAL FACTORS: 3+ comorbidities: Anxiety, depression, glaucoma, and osteopenia  are also affecting patient's functional outcome.   REHAB POTENTIAL: Good  CLINICAL DECISION MAKING: Evolving/moderate complexity  EVALUATION COMPLEXITY: Moderate   GOALS: Goals reviewed with patient? Yes  SHORT TERM GOALS: Target date: 09/01/23  Patient will be independent with her initial HEP.  Baseline: Goal status: IN PROGRESS  2.  Patient will improve her Quick DASH score to 55% disability or less for improved function with her daily activities.  Baseline: 4/25: 52.3% Goal status: MET  3.  Patient will be able to demonstrate at least 90 degrees of right shoulder flexion for improved function painting.  Baseline: 4/25: 92 degrees  Goal status:  MET  LONG TERM GOALS: Target date: 09/22/23  Patient will be independent with  her advanced HEP.  Baseline:  Goal status: IN PROGRESS  2.  Patient will improve her Quick DASH score to 45% disability or less for improved function with her daily activities.  Baseline:  Goal status: IN PROGRESS  3.  Patient will be able to demonstrate at least 120 degrees of active right shoulder flexion for improved function reaching overhead.  Baseline:  Goal status: IN PROGRESS  4.  Patient will be able to carry at least 5 pounds for improved function carrying her pocketbook.  Baseline:  Goal status: IN PROGRESS  PLAN:  PT FREQUENCY: 2x/week  PT DURATION: 6 weeks  PLANNED INTERVENTIONS: 97164- PT Re-evaluation, 97110-Therapeutic exercises, 97530- Therapeutic activity, 97112- Neuromuscular re-education, 97535- Self Care, 40981- Manual therapy, [id number]- Electrical stimulation (unattended), 97016- Vasopneumatic device, Patient/Family education, Joint mobilization, Cryotherapy, and Moist heat  PLAN FOR NEXT SESSION: PROM, AAROM, isometrics, manual therapy, and modalities as needed  Deryl Flora, PTA 09/09/2023, 11:34 AM

## 2023-09-11 ENCOUNTER — Ambulatory Visit

## 2023-09-11 DIAGNOSIS — M6281 Muscle weakness (generalized): Secondary | ICD-10-CM

## 2023-09-11 DIAGNOSIS — M25511 Pain in right shoulder: Secondary | ICD-10-CM | POA: Diagnosis not present

## 2023-09-11 DIAGNOSIS — M25611 Stiffness of right shoulder, not elsewhere classified: Secondary | ICD-10-CM

## 2023-09-11 NOTE — Therapy (Addendum)
 OUTPATIENT PHYSICAL THERAPY SHOULDER TREATMENT   Patient Name: Veronica Rojas MRN: [id number] DOB:[birth year], 78 y.o., female Today's Date: 09/11/2023  END OF SESSION:  PT End of Session - 09/11/23 1021     Visit Number 10    Number of Visits 12    Date for PT Re-Evaluation 10/31/23    PT Start Time 1015    PT Stop Time 1107    PT Time Calculation (min) 52 min    Activity Tolerance Patient tolerated treatment well    Behavior During Therapy WFL for tasks assessed/performed                Past Medical History:  Diagnosis Date   Anxiety    Depression    GERD (gastroesophageal reflux disease)    Glaucoma    left eye got bacterial infection   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   Hypercholesteremia    Macular hole of both eyes    Past Surgical History:  Procedure Laterality Date   ABDOMINAL HYSTERECTOMY     CHOLECYSTECTOMY     EYE SURGERY     pt reports 10 eye surgeries   KNEE ARTHROSCOPY Right 03/04/2017   Procedure: ARTHROSCOPY KNEE;  Surgeon: Dayne Even, MD;  Location: [facility] OR;  Service: Orthopedics;  Laterality: Right;   PARS PLANA VITRECTOMY Left 04/12/2015   Procedure: PARS PLANA VITRECTOMY 25 GAUGE FOR ENDOPHTHALMITIS/VITREOUS TAP/INJECTION OF ANTIBOTICS, REVISION OF OPERATIVE WOUND, CONJUNCTIVA FLAP;  Surgeon: Shon Downing, MD;  Location: [facility] OR;  Service: Ophthalmology;  Laterality: Left;   TUBAL LIGATION     There are no active problems to display for this patient.  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   Hand dominance: Right  PERTINENT HISTORY: Anxiety, depression, glaucoma, and osteopenia  PAIN:  Are you having pain? Yes: NPRS scale: 2-3/10 Pain location: right shoulder  PRECAUTIONS: Shoulder  RED FLAGS: None   WEIGHT BEARING RESTRICTIONS: Yes ; no pressure on her right arm yet  FALLS:  Has patient fallen in last 6 months? No  LIVING ENVIRONMENT: Lives with: lives  with their family Lives in: House/apartment Has following equipment at home: None  OCCUPATION: Retired  PLOF: Independent  PATIENT GOALS: be able color, paint, and do her puzzles  NEXT MD VISIT: 09/05/23  OBJECTIVE:  Note: Objective measures were completed at Evaluation unless otherwise noted.  PATIENT SURVEYS:  Quick Dash 65.9% disability  COGNITION: Overall cognitive status: Within functional limits for tasks assessed     SENSATION: Patient reports tingling in her right hand since her surgery  UPPER EXTREMITY ROM:   Active ROM Right eval Right 08/20/22 Left eval  Shoulder flexion 15 PROM: 90 82 137  Shoulder extension     Shoulder abduction 37 PROM: 50 70 123  Shoulder adduction     Shoulder internal rotation     Shoulder external rotation PROM: 15 degrees at neutral    Elbow flexion     Elbow extension     Wrist flexion     Wrist extension     Wrist ulnar deviation     Wrist radial deviation     Wrist pronation     Wrist supination     (Blank rows = not tested)  UPPER EXTREMITY MMT: not tested due to surgical condition   JOINT MOBILITY TESTING:  R shoulder: hypomobile and painful  PALPATION:  TTP: right upper trapezius, supraspinatus, infraspinatus, surgical incision, triceps, and biceps                                                                                                                             TREATMENT DATE:    09/11/23    EXERCISE LOG  Exercise Repetitions and Resistance Comments  UBE 10 mins 90 rpm (forward/backward)   Pulleys  6 minutes  Flexion   UE ranger (standing)  4 mins Flexion  Resisted row  Blue t-band x 20 reps    Resisted pull down  Blue t-band x 20 reps    Bicep curl 3-way 3# x 25 reps each way bil With forearms supinated  Seated cane punch out    Seated cane flexion     Therabar bending   Up and down   Therabar twisting     AA cane ER     Isometric ball squeeze  For shoulder IR   Wall ladder   Max #20  R shoulder ABD isometric      Blank cell = exercise not performed today  Modalities: no adverse reaction to today's modalities  Date:  Unattended Estim: Shoulder, IFC 80-150 Hz, 15 mins, Pain Vaso: Shoulder, 34 degrees; low pressure, 15 mins, Pain                                   08/20/23 EXERCISE LOG  Exercise Repetitions and Resistance Comments  Pulleys  4 minutes   Manual therapy See below    UE ranger (seated) 2.5 minutes  Blank cell = exercise not performed today  Manual Therapy Soft Tissue Mobilization: scar mobilization, deltoid, biceps, and pectoralis minor, for reduced pain, tone and improved soft tissue extensibility  Modalities  Date:  Vaso: Shoulder, 34 degrees; low pressure, 15 mins, Pain and Tone                                   08/18/23 EXERCISE LOG  Exercise Repetitions and Resistance Comments  Pulleys  5 minutes  For passive R shoulder flexion  Ranger Flex/ext; CW and CCW circles x 2 mins each   Scapular retraction  25 reps    Bicep curl 3-way 1# x 20 reps each RUE only   Theraputty squeeze   RUE only   Thera-bar Up/Down Red x 20 reps each    Blank cell = exercise not performed today   Manual Therapy Soft Tissue Mobilization: right shoulder, STW/M to right deltoid for reduced pain and tone  Modalities: no adverse reaction to today's modalities  Date:  Vaso: Shoulder, 34 degrees; low pressure, 15 mins, Pain  PATIENT  EDUCATION: Education details: healing and expectation for soreness Person educated: Patient Education method: Explanation Education comprehension: verbalized understanding  HOME EXERCISE PROGRAM: [id number]  ASSESSMENT:  CLINICAL IMPRESSION: Pt arrives for today's treatment session reporting 2-3/10 right shoulder pain.  Pt states that her MD is pleased with her progress and she has a follow-up appointment in 6 weeks.  Pt able to demonstrate 123 degrees of active right shoulder flexion today, meeting her long term ROM goal.  Pt also able to carry 5# weight 42 ft without any shoulder discomfort.  Normal responses to estim and vaso noted upon removal.  Pt is making good progress towards all of her goals at this time and would benefit from continuation of current POC.  Pt reported decreased pain at completion of today's treatment session.  09/11/23 PROGRESS REPORT:  Patient is making good progress with skilled physical therapy as evidenced by her objective measures, functional mobility, and progress toward his goals. She was able to meet most of her short and long term goals. However, she continues to experienced difficulty with functional activities such as putting hair up. Recommend that she continue with her current plan of care to address her remaining impairments to return to her prior level of function.   Glendora Landsman, PT, DPT   OBJECTIVE IMPAIRMENTS: decreased activity tolerance, decreased ROM, decreased strength, hypomobility, impaired sensation, impaired tone, impaired UE functional use, and pain.   ACTIVITY LIMITATIONS: carrying, lifting, sleeping, bed mobility, dressing, and reach over head  PARTICIPATION LIMITATIONS: meal prep, cleaning, laundry, shopping, and community activity  PERSONAL FACTORS: 3+ comorbidities: Anxiety, depression, glaucoma, and osteopenia are also affecting patient's functional outcome.   REHAB POTENTIAL: Good  CLINICAL DECISION MAKING: Evolving/moderate  complexity  EVALUATION COMPLEXITY: Moderate   GOALS: Goals reviewed with patient? Yes  SHORT TERM GOALS: Target date: 09/01/23  Patient will be independent with her initial HEP.  Baseline: Goal status: MET  2.  Patient will improve her Quick DASH score to 55% disability or less for improved function with her daily activities.  Baseline: 4/25: 52.3% Goal status: MET  3.  Patient will be able to demonstrate at least 90 degrees of right shoulder flexion for improved function painting.  Baseline: 4/25: 92 degrees  Goal status:  MET  LONG TERM GOALS: Target date: 09/22/23  Patient will be independent with her advanced HEP.  Baseline:  Goal status: IN PROGRESS  2.  Patient will improve her Quick DASH score to 45% disability or less for improved function with her daily activities.  Baseline: 5/8: 14% Goal status: MET  3.  Patient will be able to demonstrate at least 120 degrees of active right shoulder flexion for improved function reaching overhead.  Baseline: 5/8: 123 degrees Goal status: MET  4.  Patient will be able to carry at least 5 pounds for improved function carrying her pocketbook.  Baseline: 5/8: 42 ft with 5# weight Goal status: MET  5.  Patient will be able to put her hair up without being limited by her right shoulder for improved functional mobility.  Baseline:  Goal status: INITIAL   PLAN:  PT FREQUENCY: 2x/week  PT DURATION: 6 weeks  PLANNED INTERVENTIONS: 97164- PT Re-evaluation, 97110-Therapeutic exercises, 97530- Therapeutic activity, 97112- Neuromuscular re-education, 97535- Self Care, 62952- Manual therapy, [id number]- Electrical stimulation (unattended), 97016- Vasopneumatic device, Patient/Family education, Joint mobilization, Cryotherapy, and Moist heat  PLAN FOR NEXT SESSION: PROM, AAROM, isometrics, manual therapy, and modalities as needed  Deryl Flora, PTA 09/11/2023, 11:21 AM

## 2023-09-15 ENCOUNTER — Ambulatory Visit

## 2023-09-15 DIAGNOSIS — M25511 Pain in right shoulder: Secondary | ICD-10-CM | POA: Diagnosis not present

## 2023-09-15 DIAGNOSIS — M6281 Muscle weakness (generalized): Secondary | ICD-10-CM

## 2023-09-15 DIAGNOSIS — M25611 Stiffness of right shoulder, not elsewhere classified: Secondary | ICD-10-CM

## 2023-09-15 NOTE — Therapy (Signed)
 OUTPATIENT PHYSICAL THERAPY SHOULDER TREATMENT   Patient Name: Veronica Rojas  Pierina Lepisto MRN: [id number] DOB:[birth year], 78 y.o., female Today's Date: 09/15/2023  END OF SESSION:  PT End of Session - 09/15/23 1356     Visit Number 11    Number of Visits 12    Date for PT Re-Evaluation 10/31/23    PT Start Time 1347    PT Stop Time 1428    PT Time Calculation (min) 41 min    Activity Tolerance Patient tolerated treatment well    Behavior During Therapy WFL for tasks assessed/performed                 Past Medical History:  Diagnosis Date   Anxiety    Depression    GERD (gastroesophageal reflux disease)    Glaucoma    left eye got bacterial infection   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   Hypercholesteremia    Macular hole of both eyes    Past Surgical History:  Procedure Laterality Date   ABDOMINAL HYSTERECTOMY     CHOLECYSTECTOMY     EYE SURGERY     pt reports 10 eye surgeries   KNEE ARTHROSCOPY Right 03/04/2017   Procedure: ARTHROSCOPY KNEE;  Surgeon: Dayne Even, MD;  Location: [facility];  Service: Orthopedics;  Laterality: Right;   PARS PLANA VITRECTOMY Left 04/12/2015   Procedure: PARS PLANA VITRECTOMY 25 GAUGE FOR ENDOPHTHALMITIS/VITREOUS TAP/INJECTION OF ANTIBOTICS, REVISION OF OPERATIVE WOUND, CONJUNCTIVA FLAP;  Surgeon: Shon Downing, MD;  Location: [facility];  Service: Ophthalmology;  Laterality: Left;   TUBAL LIGATION     There are no active problems to display for this patient.  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 She has some pain if she uses a lot. She feels that she is about 90% back to her prior level of function.   Hand dominance: Right  PERTINENT HISTORY: Anxiety, depression, glaucoma, and osteopenia  PAIN:  Are you having pain? Yes: NPRS scale: 0/10 Pain location: right shoulder  PRECAUTIONS: Shoulder  RED FLAGS: None   WEIGHT BEARING RESTRICTIONS: Yes ;  no pressure on her right arm yet  FALLS:  Has patient fallen in last 6 months? No  LIVING ENVIRONMENT: Lives with: lives with their family Lives in: House/apartment Has following equipment at home: None  OCCUPATION: Retired  PLOF: Independent  PATIENT GOALS: be able color, paint, and do her puzzles  NEXT MD VISIT: 09/05/23  OBJECTIVE:  Note: Objective measures were completed at Evaluation unless otherwise noted.  PATIENT SURVEYS:  Quick Dash 65.9% disability  COGNITION: Overall cognitive status: Within functional limits for tasks assessed     SENSATION: Patient reports tingling in her right hand since her surgery  UPPER EXTREMITY ROM:   Active ROM Right eval Right 08/20/22 Left eval  Shoulder flexion 15 PROM: 90 82 137  Shoulder extension     Shoulder abduction 37 PROM: 50 70 123  Shoulder adduction     Shoulder internal rotation     Shoulder external rotation PROM: 15 degrees at neutral    Elbow flexion     Elbow extension     Wrist flexion     Wrist extension     Wrist ulnar deviation     Wrist radial deviation     Wrist pronation     Wrist supination     (Blank rows = not tested)  UPPER EXTREMITY MMT: not tested due to surgical condition   JOINT MOBILITY TESTING:  R shoulder: hypomobile and painful  PALPATION:  TTP: right upper trapezius, supraspinatus, infraspinatus, surgical incision, triceps, and biceps                                                                                                                              TREATMENT DATE:                                    09/15/23 EXERCISE LOG  Exercise Repetitions and Resistance Comments  Pulleys 6 minutes   UBE  10 minutes @ 90 RPM    UE ranger (standing)  2.5 minutes  Flexion  Shoulder flexion AROM assessment  116 degrees RUE   Waist to overhead  4# x 2 x 10 reps  Utilizing BUE   Bicep curl  4# x 2 minutes  Standing  Standing shoulder ABD  25 reps  RUE only        Blank cell = exercise not performed today   09/11/23    EXERCISE LOG  Exercise Repetitions and Resistance Comments  UBE 10 mins 90 rpm (forward/backward)   Pulleys  6 minutes  Flexion   UE ranger (standing)  4 mins Flexion  Resisted row  Blue t-band x 20 reps    Resisted pull down  Blue t-band x 20 reps    Bicep curl 3-way 3# x 25 reps each way bil With forearms supinated  Seated cane punch out    Seated cane flexion  Therabar bending   Up and down   Therabar twisting     AA cane ER     Isometric ball squeeze  For shoulder IR   Wall ladder   Max #20  R shoulder ABD isometric      Blank cell = exercise not performed today  Modalities: no adverse reaction to today's modalities  Date:  Unattended Estim: Shoulder, IFC 80-150 Hz, 15 mins, Pain Vaso: Shoulder, 34 degrees; low pressure, 15 mins, Pain                                   08/20/23 EXERCISE LOG  Exercise Repetitions and Resistance Comments  Pulleys  4 minutes   Manual therapy See below    UE ranger (seated) 2.5 minutes            Blank cell = exercise not performed today  Manual Therapy Soft Tissue Mobilization: scar mobilization, deltoid, biceps, and pectoralis minor, for reduced pain, tone and improved soft tissue extensibility  Modalities  Date:  Vaso: Shoulder, 34 degrees; low pressure, 15 mins, Pain and Tone  PATIENT EDUCATION: Education details: progress with therapy, healing, POC, and benefits of her HEP Person educated:  Patient Education method: Explanation Education comprehension: verbalized understanding  HOME EXERCISE PROGRAM: [id number]  ASSESSMENT:  CLINICAL IMPRESSION: Patient was progressed with new and familiar interventions for improved functional mobility. She required minimal cueing with today's standing interventions for upright stance to limit trunk mobility. She experienced no increase in pain or discomfort with any of today's interventions. She reported feeling good upon the conclusion of treatment. She continues to require skilled physical therapy to address her remaining impairments to return to her prior level of function.   OBJECTIVE IMPAIRMENTS: decreased activity tolerance, decreased ROM, decreased strength, hypomobility, impaired sensation, impaired tone, impaired UE functional use, and pain.   ACTIVITY LIMITATIONS: carrying, lifting, sleeping, bed mobility, dressing, and reach over head  PARTICIPATION LIMITATIONS: meal prep, cleaning, laundry, shopping, and community activity  PERSONAL FACTORS: 3+ comorbidities: Anxiety, depression, glaucoma, and osteopenia are also affecting patient's functional outcome.   REHAB POTENTIAL: Good  CLINICAL DECISION MAKING: Evolving/moderate complexity  EVALUATION COMPLEXITY: Moderate   GOALS: Goals reviewed with patient? Yes  SHORT TERM GOALS: Target date: 09/01/23  Patient will be independent with her initial HEP.  Baseline: Goal status: MET  2.  Patient will improve her Quick DASH score to 55% disability or less for improved function with her daily activities.  Baseline: 4/25: 52.3% Goal status: MET  3.  Patient will be able to demonstrate at least 90 degrees of right shoulder flexion for improved function painting.  Baseline: 4/25: 92 degrees  Goal status:  MET  LONG TERM GOALS: Target date: 09/22/23  Patient will be independent with her advanced HEP.  Baseline:  Goal status: IN PROGRESS  2.  Patient will improve her Quick DASH  score to 45% disability or less for improved function with her daily activities.  Baseline: 5/8: 14% Goal status: MET  3.  Patient will be able to demonstrate at least 120 degrees of active right shoulder flexion for improved function reaching overhead.  Baseline: 5/8: 123 degrees Goal status: MET  4.  Patient will be able to carry at least 5 pounds for improved function carrying her pocketbook.  Baseline: 5/8: 42 ft with 5# weight Goal status: MET  5.  Patient will be able to put her hair up without  being limited by her right shoulder for improved functional mobility.  Baseline:  Goal status: INITIAL   PLAN:  PT FREQUENCY: 2x/week  PT DURATION: 6 weeks  PLANNED INTERVENTIONS: 97164- PT Re-evaluation, 97110-Therapeutic exercises, 97530- Therapeutic activity, [id number]- Neuromuscular re-education, 97535- Self Care, 16109- Manual therapy, [id number]- Electrical stimulation (unattended), 97016- Vasopneumatic device, Patient/Family education, Joint mobilization, Cryotherapy, and Moist heat  PLAN FOR NEXT SESSION: PROM, AAROM, isometrics, manual therapy, and modalities as needed  Lane Pinon, PT 09/15/2023, 3:20 PM

## 2023-09-19 ENCOUNTER — Ambulatory Visit

## 2023-09-19 DIAGNOSIS — M25611 Stiffness of right shoulder, not elsewhere classified: Secondary | ICD-10-CM

## 2023-09-19 DIAGNOSIS — M25511 Pain in right shoulder: Secondary | ICD-10-CM

## 2023-09-19 DIAGNOSIS — M6281 Muscle weakness (generalized): Secondary | ICD-10-CM

## 2023-09-19 NOTE — Therapy (Addendum)
 OUTPATIENT PHYSICAL THERAPY SHOULDER TREATMENT   Patient Name: Veronica  Mekhia Rojas MRN: [id number] DOB:[birth year], 78 y.o., female Today's Date: 09/19/2023  END OF SESSION:  PT End of Session - 09/19/23 1022     Visit Number 12    Number of Visits 12    Date for PT Re-Evaluation 10/31/23    PT Start Time 1015    PT Stop Time 1114    PT Time Calculation (min) 59 min    Activity Tolerance Patient tolerated treatment well    Behavior During Therapy WFL for tasks assessed/performed                 Past Medical History:  Diagnosis Date   Anxiety    Depression    GERD (gastroesophageal reflux disease)    Glaucoma    left eye got bacterial infection   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   Hypercholesteremia    Macular hole of both eyes    Past Surgical History:  Procedure Laterality Date   ABDOMINAL HYSTERECTOMY     CHOLECYSTECTOMY     EYE SURGERY     pt reports 10 eye surgeries   KNEE ARTHROSCOPY Right 03/04/2017   Procedure: ARTHROSCOPY KNEE;  Surgeon: Dayne Even, MD;  Location: [facility] OR;  Service: Orthopedics;  Laterality: Right;   PARS PLANA VITRECTOMY Left 04/12/2015   Procedure: PARS PLANA VITRECTOMY 25 GAUGE FOR ENDOPHTHALMITIS/VITREOUS TAP/INJECTION OF ANTIBOTICS, REVISION OF OPERATIVE WOUND, CONJUNCTIVA FLAP;  Surgeon: Shon Downing, MD;  Location: [facility] OR;  Service: Ophthalmology;  Laterality: Left;   TUBAL LIGATION     There are no active problems to display for this patient.  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  Pt ready for discharge today.   Hand dominance: Right  PERTINENT HISTORY: Anxiety, depression, glaucoma, and osteopenia  PAIN:  Are you having pain? Yes: NPRS scale: 2/10 Pain location: right shoulder  PRECAUTIONS: Shoulder  RED FLAGS: None   WEIGHT BEARING RESTRICTIONS: Yes ; no pressure on her right arm yet  FALLS:  Has patient fallen in last 6 months? No  LIVING ENVIRONMENT: Lives with: lives with their family Lives in: House/apartment Has following equipment at home: None  OCCUPATION: Retired  PLOF: Independent  PATIENT GOALS: be able color, paint, and do her puzzles  NEXT MD VISIT: 09/05/23  OBJECTIVE:  Note: Objective measures were completed at Evaluation unless otherwise noted.  PATIENT SURVEYS:  Quick Dash 65.9% disability  COGNITION: Overall cognitive status: Within functional limits for tasks assessed     SENSATION: Patient reports tingling in her right hand since her surgery  UPPER EXTREMITY ROM:   Active ROM Right eval Right 08/20/22 Left eval  Shoulder flexion 15 PROM: 90 82 137  Shoulder extension     Shoulder abduction 37 PROM: 50 70 123  Shoulder adduction     Shoulder internal rotation     Shoulder external rotation PROM: 15 degrees at neutral    Elbow flexion     Elbow extension     Wrist flexion     Wrist extension     Wrist ulnar deviation     Wrist radial deviation     Wrist pronation     Wrist supination     (Blank rows = not tested)  UPPER EXTREMITY MMT: not tested due to surgical condition   JOINT MOBILITY TESTING:  R shoulder: hypomobile and painful  PALPATION:  TTP: right upper trapezius, supraspinatus, infraspinatus, surgical incision, triceps, and biceps                                                                                                                              TREATMENT DATE:                                    09/19/23 EXERCISE LOG  Exercise Repetitions and Resistance Comments  Pulleys 6 minutes   UBE  10 minutes @ 90 RPM    UE ranger (standing)  3 minutes  Flexion  Shoulder abduction 1# 2 sets 10 reps   Shoulder flexion  2 sets 10 reps   Shoulder flexion AROM assessment   RUE   Waist to overhead  4# x 2 x 10 reps  Utilizing BUE   Bicep curl  4# x 2 minutes x 2 reps Standing  IR Stretch 30 sec x 4 reps RUE only        Blank cell = exercise not performed today   09/11/23    EXERCISE LOG  Exercise Repetitions and Resistance Comments  UBE 10 mins 90 rpm (forward/backward)   Pulleys  6 minutes  Flexion   UE ranger (standing)  4 mins Flexion  Resisted row  Blue t-band x 20 reps    Resisted pull down  Blue t-band x 20 reps    Bicep curl 3-way 3# x 25 reps each way bil With forearms supinated  Seated cane punch out    Seated cane flexion  Therabar bending   Up and down   Therabar twisting     AA cane ER     Isometric ball squeeze  For shoulder IR   Wall ladder   Max #20  R shoulder ABD isometric      Blank cell = exercise not performed today  Modalities: no adverse reaction to today's modalities  Date:  Unattended Estim: Shoulder, IFC 80-150 Hz, 15 mins, Pain Vaso: Shoulder, 34 degrees; low pressure, 15 mins, Pain                                   08/20/23 EXERCISE LOG  Exercise Repetitions and Resistance Comments  Pulleys  4 minutes   Manual therapy See below    UE ranger (seated) 2.5 minutes            Blank cell = exercise not performed today  Manual Therapy Soft Tissue Mobilization: scar mobilization, deltoid, biceps, and pectoralis minor, for reduced pain, tone and improved soft tissue extensibility  Modalities  Date:  Vaso: Shoulder, 34 degrees; low pressure, 15 mins, Pain and Tone  PATIENT EDUCATION: Education details: progress with therapy, healing, POC, and benefits of her HEP Person educated:  Patient Education method: Explanation Education comprehension: verbalized understanding  HOME EXERCISE PROGRAM: [id number]  ASSESSMENT:  CLINICAL IMPRESSION: Pt arrives for today's treatment session reporting 2/10 right shoulder pain.  Pt reports increased ability to fix her hair without right shoulder difficulty.  Pt also reports that she is performing her HEP regularly at home.  Pt has met all of the goals set forth for her by physical therapy at this time.  Normal responses to estim and Mh noted upon removal.  Pt encouraged to call the facility with any questions or concerns.  Pt denied any pain at completion of today's treatment session.  Pt ready for discharge at this time.   PHYSICAL THERAPY DISCHARGE SUMMARY  Visits from Start of Care: 12  Current functional level related to goals / functional outcomes: Patient was able to meet all of her goals for skilled physical therapy.    Remaining deficits: None    Education / Equipment: HEP   Patient agrees to discharge. Patient goals were met. Patient is being discharged due to meeting the stated rehab goals.  Glendora Landsman, PT, DPT    OBJECTIVE IMPAIRMENTS: decreased activity tolerance, decreased ROM, decreased strength, hypomobility, impaired sensation, impaired tone, impaired UE functional use, and pain.   ACTIVITY LIMITATIONS: carrying, lifting, sleeping, bed mobility, dressing, and reach over head  PARTICIPATION LIMITATIONS: meal prep, cleaning, laundry, shopping, and community activity  PERSONAL FACTORS: 3+ comorbidities: Anxiety, depression, glaucoma, and osteopenia are also affecting patient's functional outcome.   REHAB POTENTIAL: Good  CLINICAL DECISION MAKING: Evolving/moderate complexity  EVALUATION COMPLEXITY: Moderate   GOALS: Goals reviewed with patient? Yes  SHORT TERM GOALS: Target date: 09/01/23  Patient will be independent with her initial HEP.  Baseline: Goal status: MET  2.  Patient will improve  her Quick DASH score to 55% disability or less for improved function with her daily activities.  Baseline: 4/25: 52.3% Goal status: MET  3.  Patient will be able to demonstrate at least 90 degrees of right shoulder flexion for improved function painting.  Baseline: 4/25: 92 degrees  Goal status:  MET  LONG TERM GOALS: Target date: 09/22/23  Patient will be independent with her advanced HEP.  Baseline:  Goal status: MET  2.  Patient will improve her Quick DASH score to 45% disability or less for improved function with her daily activities.  Baseline: 5/8: 14% Goal status: MET  3.  Patient will be able to demonstrate at least 120 degrees of active right shoulder flexion for improved function reaching overhead.  Baseline: 5/8: 123 degrees Goal status: MET  4.  Patient will be able to carry at least 5 pounds for improved function carrying her pocketbook.  Baseline: 5/8: 42 ft with 5# weight Goal status: MET  5.  Patient will be able to put her hair up without being limited by her right shoulder for improved functional mobility.  Baseline:  Goal status: MET   PLAN:  PT FREQUENCY: 2x/week  PT DURATION: 6 weeks  PLANNED INTERVENTIONS: 97164- PT Re-evaluation, 97110-Therapeutic exercises, 97530- Therapeutic activity, 97112- Neuromuscular re-education, 97535- Self Care, [id number]- Manual therapy, [id number]- Electrical stimulation (unattended), 97016- Vasopneumatic device, Patient/Family education, Joint mobilization, Cryotherapy, and Moist heat  PLAN FOR NEXT SESSION: PROM, AAROM, isometrics, manual therapy, and modalities as needed  Deryl Flora, PTA 09/19/2023, 11:34 AM

## 2024-03-01 ENCOUNTER — Other Ambulatory Visit: Payer: Self-pay | Admitting: Family Medicine

## 2024-03-01 DIAGNOSIS — Z1231 Encounter for screening mammogram for malignant neoplasm of breast: Secondary | ICD-10-CM

## 2024-04-02 LAB — COLOGUARD: COLOGUARD: POSITIVE — AB

## 2024-04-06 ENCOUNTER — Ambulatory Visit

## 2024-05-11 ENCOUNTER — Ambulatory Visit
Admission: RE | Admit: 2024-05-11 | Discharge: 2024-05-11 | Disposition: A | Source: Ambulatory Visit | Attending: Family Medicine | Admitting: Family Medicine

## 2024-05-11 DIAGNOSIS — Z1231 Encounter for screening mammogram for malignant neoplasm of breast: Secondary | ICD-10-CM
